# Patient Record
Sex: Male | Born: 1935 | Race: White | Hispanic: No | Marital: Married | Smoking: Never smoker
Health system: Southern US, Community
[De-identification: ages and names within clinical notes are randomized; demographics above are authoritative.]

## PROBLEM LIST (undated history)

## (undated) DIAGNOSIS — I10 Essential (primary) hypertension: Secondary | ICD-10-CM

## (undated) DIAGNOSIS — E119 Type 2 diabetes mellitus without complications: Secondary | ICD-10-CM

## (undated) DIAGNOSIS — M1990 Unspecified osteoarthritis, unspecified site: Secondary | ICD-10-CM

## (undated) DIAGNOSIS — E785 Hyperlipidemia, unspecified: Secondary | ICD-10-CM

## (undated) DIAGNOSIS — I341 Nonrheumatic mitral (valve) prolapse: Secondary | ICD-10-CM

## (undated) DIAGNOSIS — I2510 Atherosclerotic heart disease of native coronary artery without angina pectoris: Secondary | ICD-10-CM

## (undated) DIAGNOSIS — C439 Malignant melanoma of skin, unspecified: Secondary | ICD-10-CM

## (undated) DIAGNOSIS — B351 Tinea unguium: Secondary | ICD-10-CM

## (undated) DIAGNOSIS — K219 Gastro-esophageal reflux disease without esophagitis: Secondary | ICD-10-CM

## (undated) DIAGNOSIS — Z87442 Personal history of urinary calculi: Secondary | ICD-10-CM

## (undated) DIAGNOSIS — H269 Unspecified cataract: Secondary | ICD-10-CM

## (undated) DIAGNOSIS — C259 Malignant neoplasm of pancreas, unspecified: Secondary | ICD-10-CM

## (undated) HISTORY — DX: Gastro-esophageal reflux disease without esophagitis: K21.9

## (undated) HISTORY — DX: Atherosclerotic heart disease of native coronary artery without angina pectoris: I25.10

## (undated) HISTORY — DX: Malignant melanoma of skin, unspecified: C43.9

## (undated) HISTORY — DX: Unspecified cataract: H26.9

## (undated) HISTORY — DX: Hyperlipidemia, unspecified: E78.5

## (undated) HISTORY — DX: Tinea unguium: B35.1

## (undated) HISTORY — PX: SKIN CANCER EXCISION: SHX779

## (undated) HISTORY — DX: Personal history of urinary calculi: Z87.442

## (undated) HISTORY — PX: CHOLECYSTECTOMY: SHX55

## (undated) HISTORY — DX: Nonrheumatic mitral (valve) prolapse: I34.1

## (undated) HISTORY — DX: Unspecified osteoarthritis, unspecified site: M19.90

## (undated) HISTORY — DX: Malignant neoplasm of pancreas, unspecified: C25.9

---

## 2004-12-04 ENCOUNTER — Ambulatory Visit: Payer: Self-pay | Admitting: Surgery

## 2005-11-18 ENCOUNTER — Other Ambulatory Visit: Payer: Self-pay

## 2005-11-18 ENCOUNTER — Ambulatory Visit: Payer: Self-pay | Admitting: Surgery

## 2005-11-25 ENCOUNTER — Ambulatory Visit: Payer: Self-pay | Admitting: Surgery

## 2006-06-14 ENCOUNTER — Emergency Department: Payer: Self-pay | Admitting: Emergency Medicine

## 2006-06-30 ENCOUNTER — Emergency Department: Payer: Self-pay

## 2009-03-25 ENCOUNTER — Ambulatory Visit: Payer: Self-pay | Admitting: Cardiovascular Disease

## 2009-11-16 ENCOUNTER — Emergency Department: Payer: Self-pay | Admitting: Emergency Medicine

## 2010-08-19 ENCOUNTER — Ambulatory Visit: Payer: Self-pay | Admitting: Emergency Medicine

## 2010-08-21 ENCOUNTER — Ambulatory Visit: Payer: Self-pay | Admitting: Emergency Medicine

## 2010-08-31 ENCOUNTER — Ambulatory Visit: Payer: Self-pay | Admitting: Internal Medicine

## 2010-09-03 LAB — PATHOLOGY REPORT

## 2010-09-18 ENCOUNTER — Ambulatory Visit: Payer: Self-pay | Admitting: Internal Medicine

## 2010-09-30 ENCOUNTER — Ambulatory Visit: Payer: Self-pay | Admitting: Internal Medicine

## 2010-10-02 ENCOUNTER — Ambulatory Visit: Payer: Self-pay | Admitting: Emergency Medicine

## 2010-10-14 LAB — PATHOLOGY REPORT

## 2010-10-31 ENCOUNTER — Ambulatory Visit: Payer: Self-pay | Admitting: Internal Medicine

## 2010-11-30 ENCOUNTER — Ambulatory Visit: Payer: Self-pay | Admitting: Internal Medicine

## 2010-12-31 ENCOUNTER — Ambulatory Visit: Payer: Self-pay | Admitting: Internal Medicine

## 2011-01-31 ENCOUNTER — Ambulatory Visit: Payer: Self-pay | Admitting: Internal Medicine

## 2011-01-31 ENCOUNTER — Emergency Department: Payer: Self-pay | Admitting: Unknown Physician Specialty

## 2011-03-02 ENCOUNTER — Ambulatory Visit: Payer: Self-pay | Admitting: Internal Medicine

## 2011-03-04 ENCOUNTER — Inpatient Hospital Stay: Payer: Self-pay | Admitting: Emergency Medicine

## 2011-03-05 DIAGNOSIS — I4891 Unspecified atrial fibrillation: Secondary | ICD-10-CM

## 2011-03-25 ENCOUNTER — Ambulatory Visit: Payer: Self-pay | Admitting: Internal Medicine

## 2011-04-02 ENCOUNTER — Ambulatory Visit: Payer: Self-pay | Admitting: Internal Medicine

## 2011-04-17 ENCOUNTER — Encounter: Payer: Self-pay | Admitting: Emergency Medicine

## 2011-05-02 ENCOUNTER — Encounter: Payer: Self-pay | Admitting: Emergency Medicine

## 2011-06-02 ENCOUNTER — Encounter: Payer: Self-pay | Admitting: Emergency Medicine

## 2011-07-10 ENCOUNTER — Ambulatory Visit: Payer: Self-pay | Admitting: Internal Medicine

## 2011-07-10 LAB — CBC CANCER CENTER
Basophil #: 0 x10 3/mm (ref 0.0–0.1)
Eosinophil %: 2 %
Lymphocyte #: 0.6 x10 3/mm — ABNORMAL LOW (ref 1.0–3.6)
Lymphocyte %: 12.9 %
MCH: 30.1 pg (ref 26.0–34.0)
MCV: 89 fL (ref 80–100)
Monocyte #: 0.4 x10 3/mm (ref 0.0–0.7)
Monocyte %: 8 %
Platelet: 119 x10 3/mm — ABNORMAL LOW (ref 150–440)
RBC: 4.24 10*6/uL — ABNORMAL LOW (ref 4.40–5.90)
RDW: 13.9 % (ref 11.5–14.5)
WBC: 4.8 x10 3/mm (ref 3.8–10.6)

## 2011-07-10 LAB — CREATININE, SERUM
Creatinine: 1.41 mg/dL — ABNORMAL HIGH (ref 0.60–1.30)
EGFR (Non-African Amer.): 51 — ABNORMAL LOW

## 2011-07-10 LAB — HEPATIC FUNCTION PANEL A (ARMC)
Albumin: 4.2 g/dL (ref 3.4–5.0)
Alkaline Phosphatase: 64 U/L (ref 50–136)
Bilirubin, Direct: 0.1 mg/dL (ref 0.00–0.20)
Bilirubin,Total: 0.5 mg/dL (ref 0.2–1.0)

## 2011-07-10 LAB — LACTATE DEHYDROGENASE: LDH: 155 U/L (ref 87–241)

## 2011-07-31 ENCOUNTER — Ambulatory Visit: Payer: Self-pay | Admitting: Internal Medicine

## 2011-10-07 ENCOUNTER — Ambulatory Visit: Payer: Self-pay | Admitting: Internal Medicine

## 2011-10-07 LAB — CBC CANCER CENTER
Basophil %: 0.6 %
Eosinophil #: 0.1 x10 3/mm (ref 0.0–0.7)
Eosinophil %: 2 %
Lymphocyte %: 13.7 %
MCH: 29.4 pg (ref 26.0–34.0)
MCV: 90 fL (ref 80–100)
Monocyte #: 0.4 x10 3/mm (ref 0.2–1.0)
Monocyte %: 7.7 %
Neutrophil #: 3.9 x10 3/mm (ref 1.4–6.5)
Platelet: 119 x10 3/mm — ABNORMAL LOW (ref 150–440)
RBC: 4.29 10*6/uL — ABNORMAL LOW (ref 4.40–5.90)

## 2011-10-07 LAB — CREATININE, SERUM: EGFR (Non-African Amer.): 49 — ABNORMAL LOW

## 2011-10-07 LAB — LACTATE DEHYDROGENASE: LDH: 151 U/L (ref 87–241)

## 2011-10-07 LAB — HEPATIC FUNCTION PANEL A (ARMC)
Albumin: 3.9 g/dL (ref 3.4–5.0)
Alkaline Phosphatase: 53 U/L (ref 50–136)
Bilirubin, Direct: 0.1 mg/dL (ref 0.00–0.20)
Bilirubin,Total: 0.6 mg/dL (ref 0.2–1.0)
SGPT (ALT): 23 U/L
Total Protein: 6.9 g/dL (ref 6.4–8.2)

## 2011-10-31 ENCOUNTER — Ambulatory Visit: Payer: Self-pay | Admitting: Internal Medicine

## 2012-01-07 ENCOUNTER — Ambulatory Visit: Payer: Self-pay | Admitting: Internal Medicine

## 2012-01-31 ENCOUNTER — Ambulatory Visit: Payer: Self-pay | Admitting: Internal Medicine

## 2012-04-06 ENCOUNTER — Ambulatory Visit: Payer: Self-pay | Admitting: Internal Medicine

## 2012-04-06 LAB — CBC CANCER CENTER
Basophil #: 0 x10 3/mm (ref 0.0–0.1)
Eosinophil #: 0.1 x10 3/mm (ref 0.0–0.7)
Eosinophil %: 1.8 %
HCT: 39.7 % — ABNORMAL LOW (ref 40.0–52.0)
Lymphocyte #: 0.6 x10 3/mm — ABNORMAL LOW (ref 1.0–3.6)
MCH: 30.2 pg (ref 26.0–34.0)
MCHC: 32.5 g/dL (ref 32.0–36.0)
MCV: 93 fL (ref 80–100)
Monocyte #: 0.3 x10 3/mm (ref 0.2–1.0)
Platelet: 106 x10 3/mm — ABNORMAL LOW (ref 150–440)
RBC: 4.28 10*6/uL — ABNORMAL LOW (ref 4.40–5.90)

## 2012-04-06 LAB — HEPATIC FUNCTION PANEL A (ARMC)
Alkaline Phosphatase: 47 U/L — ABNORMAL LOW (ref 50–136)
Bilirubin, Direct: 0.1 mg/dL (ref 0.00–0.20)
Bilirubin,Total: 0.6 mg/dL (ref 0.2–1.0)
SGPT (ALT): 20 U/L (ref 12–78)
Total Protein: 7 g/dL (ref 6.4–8.2)

## 2012-04-06 LAB — CREATININE, SERUM
Creatinine: 1.52 mg/dL — ABNORMAL HIGH (ref 0.60–1.30)
EGFR (Non-African Amer.): 43 — ABNORMAL LOW

## 2012-04-06 LAB — FERRITIN: Ferritin (ARMC): 28 ng/mL (ref 8–388)

## 2012-04-06 LAB — FOLATE: Folic Acid: 18.6 ng/mL (ref 3.1–100.0)

## 2012-05-01 ENCOUNTER — Ambulatory Visit: Payer: Self-pay | Admitting: Internal Medicine

## 2012-05-02 IMAGING — US US EXTREM UP VENOUS*L*
1 series · 17 of 24 positions shown · non-contrast
Comparison: none

REASON FOR EXAM: cr [id number] eval for dvt pain swelling ademia
COMMENTS:

PROCEDURE:     US  - US DOPPLER UP EXTR LEFT  - March 25, 2011  [DATE]
RESULT:     Comparison: None
TECHNIQUE: Multiple gray-scale, color-flow Doppler, and spectral waveform
tracings of the left internal jugular vein, brachiocephalic vein, and
proximal deep veins of the left upper extremity from the antecubital fossa
to the brachiocephalic vein are presented for review.

[Series 1: us extrem up venous*left* · 17 of 42 slices shown]
[im 1/42]
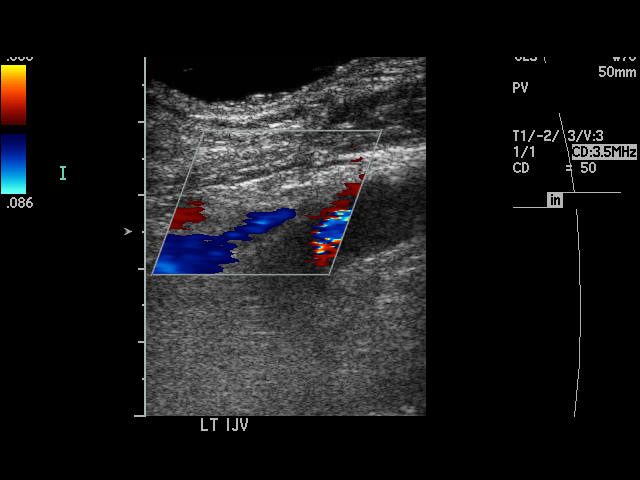
[im 4/42]
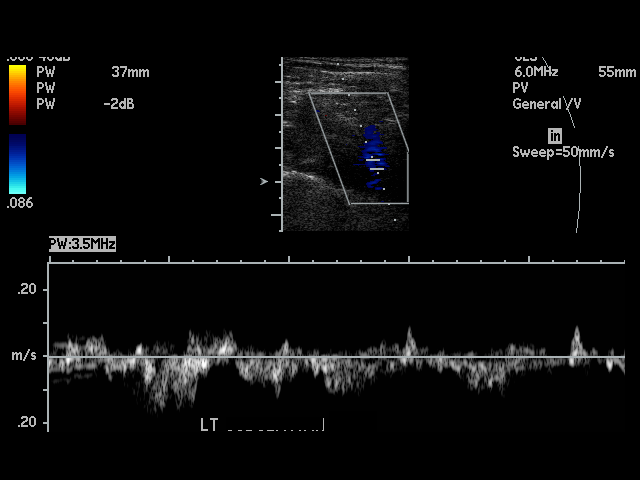
[im 6/42]
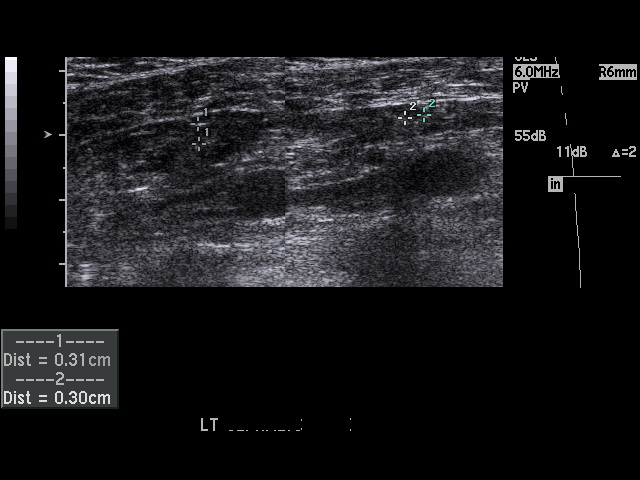
[im 8/42]
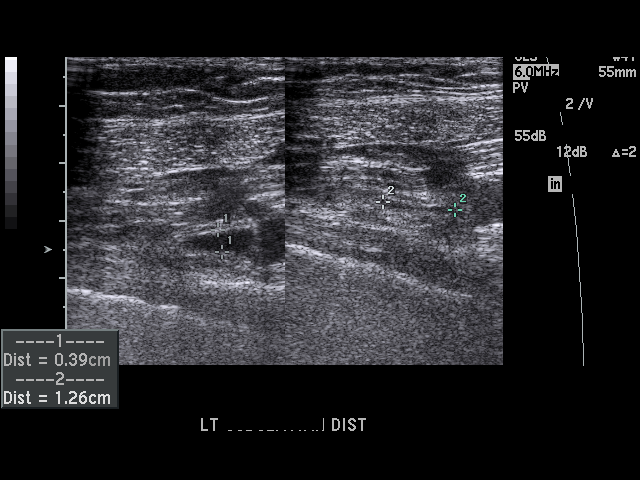
[im 11/42]
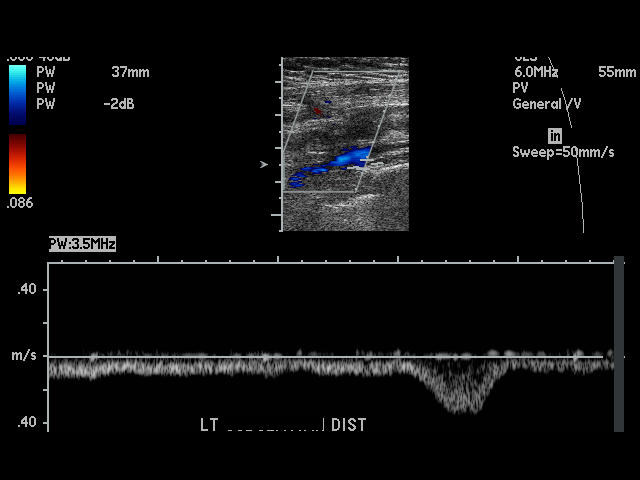
[im 13/42]
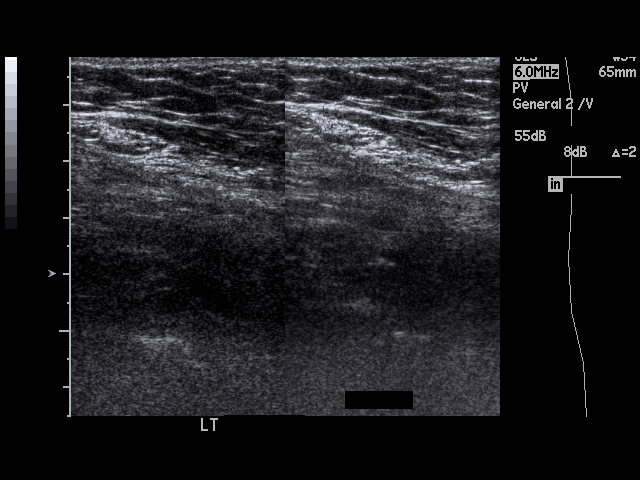
[im 17/42]
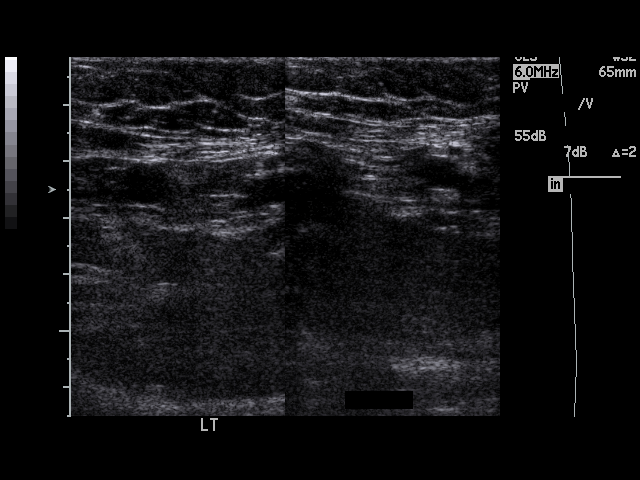
[im 18/42]
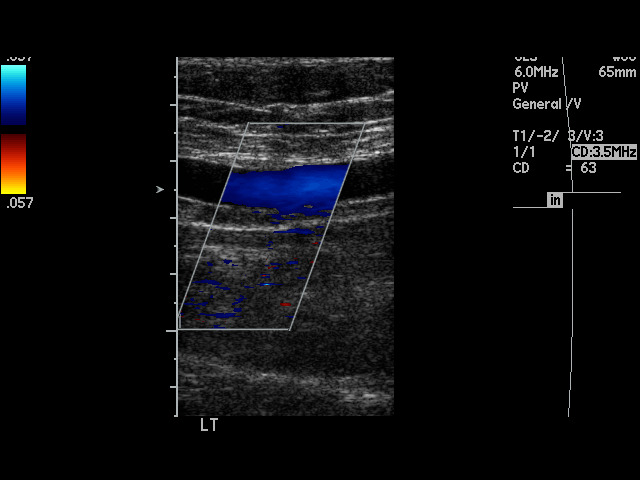
[im 22/42]
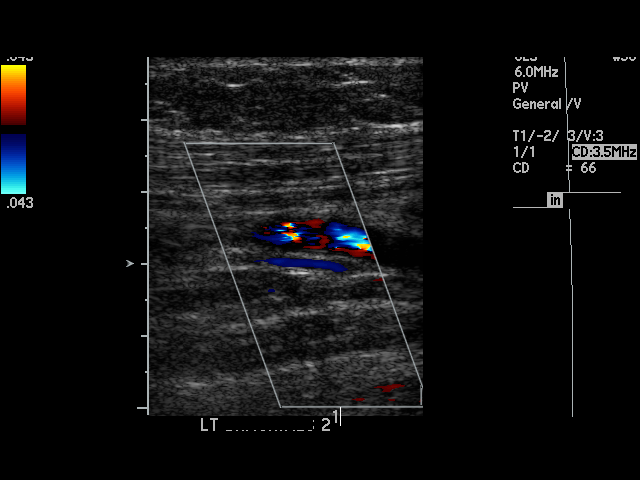
[im 24/42]
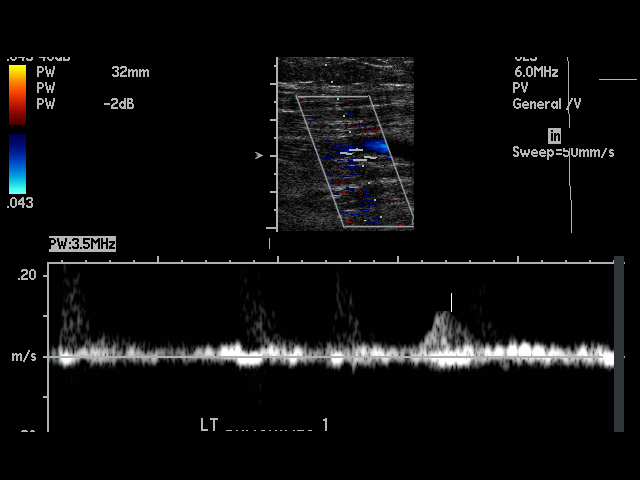
[im 25/42]
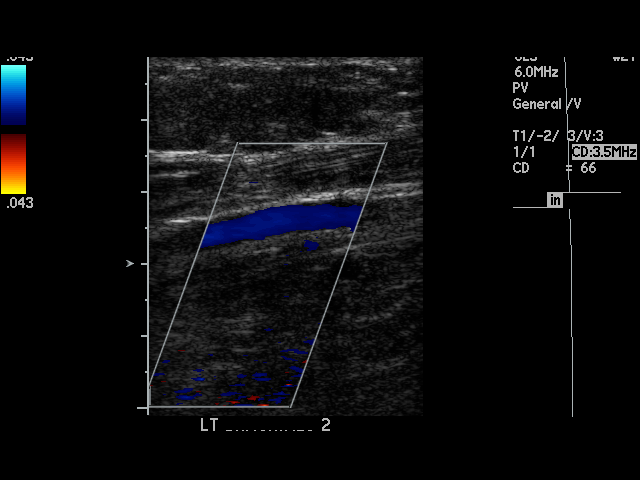
[im 29/42]
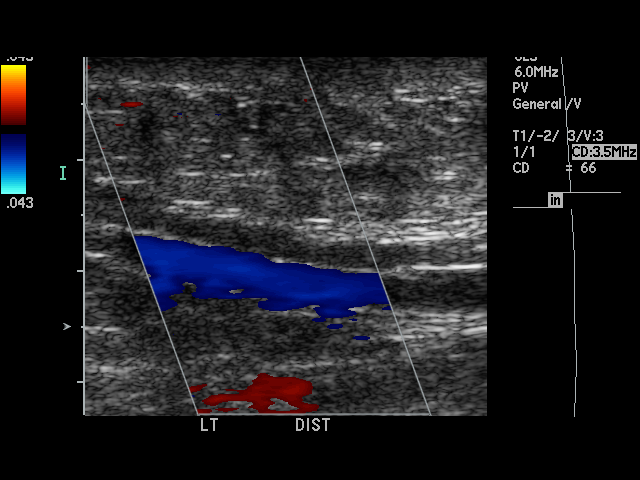
[im 31/42]
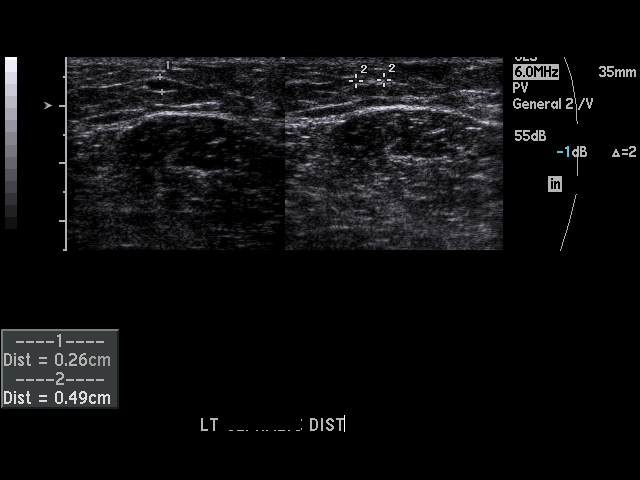
[im 34/42]
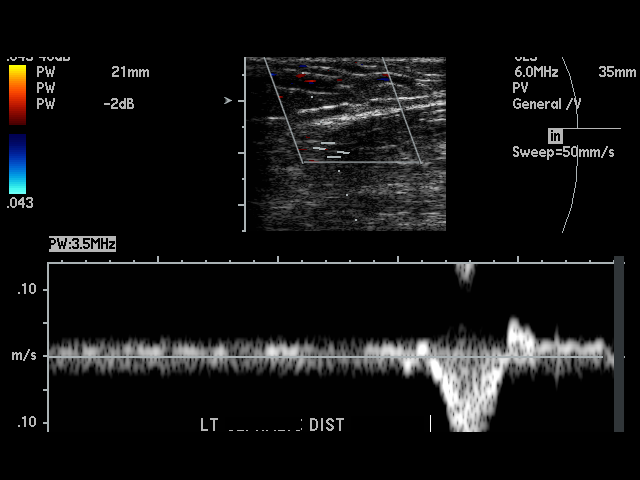
[im 36/42]
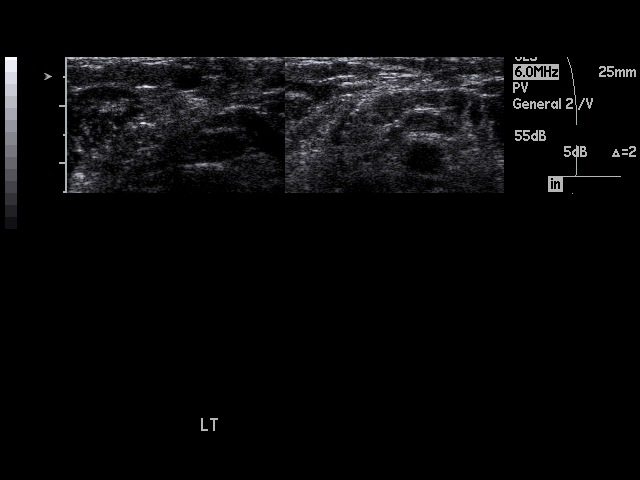
[im 38/42]
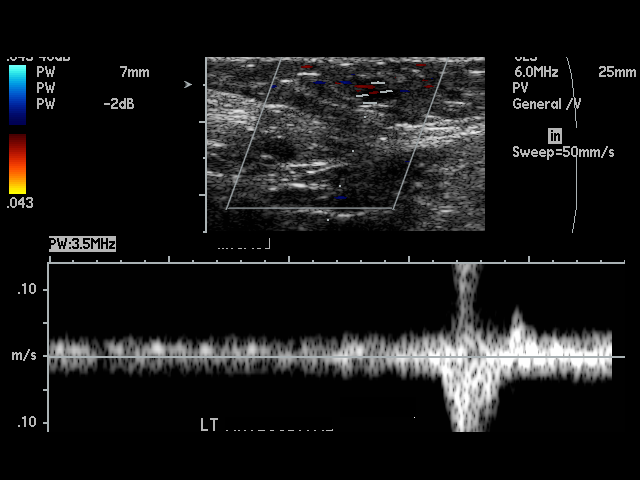
[im 42/42]
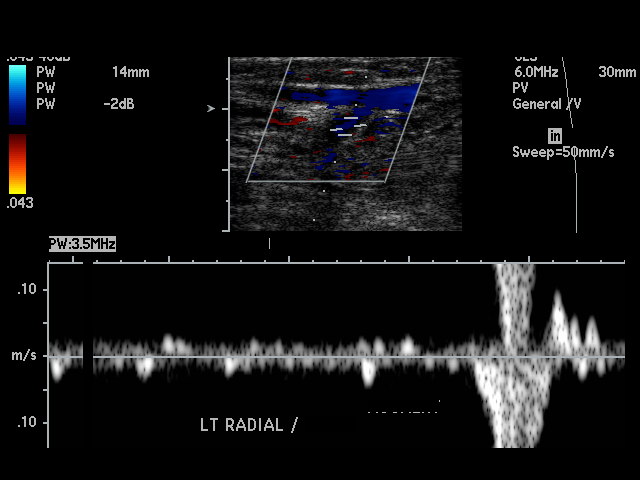

[17 of 24 positions shown; findings below may reference images not displayed]

FINDINGS: There is normal blood flow within the left internal jugular vein, visualized
portions of the brachiocephalic vein, and left upper extremity deep venous
system from the antecubital fossa to the brachiocephalic vein.
IMPRESSION: No sonographic evidence of deep venous thrombosis in the left upper
extremity.

## 2012-09-29 ENCOUNTER — Ambulatory Visit: Payer: Self-pay | Admitting: Ophthalmology

## 2012-09-29 ENCOUNTER — Ambulatory Visit: Payer: Self-pay | Admitting: Internal Medicine

## 2012-09-29 DIAGNOSIS — I10 Essential (primary) hypertension: Secondary | ICD-10-CM

## 2012-10-04 LAB — CBC CANCER CENTER
Eosinophil #: 0.1 x10 3/mm (ref 0.0–0.7)
Eosinophil %: 2.4 %
HCT: 37.8 % — ABNORMAL LOW (ref 40.0–52.0)
MCH: 29.8 pg (ref 26.0–34.0)
MCV: 90 fL (ref 80–100)
Monocyte #: 0.4 x10 3/mm (ref 0.2–1.0)
Neutrophil %: 75.4 %
Platelet: 117 x10 3/mm — ABNORMAL LOW (ref 150–440)
RBC: 4.2 10*6/uL — ABNORMAL LOW (ref 4.40–5.90)
WBC: 5.4 x10 3/mm (ref 3.8–10.6)

## 2012-10-04 LAB — CREATININE, SERUM
Creatinine: 1.38 mg/dL — ABNORMAL HIGH (ref 0.60–1.30)
EGFR (African American): 55 — ABNORMAL LOW
EGFR (Non-African Amer.): 48 — ABNORMAL LOW

## 2012-10-04 LAB — FOLATE: Folic Acid: 13.4 ng/mL (ref 3.1–100.0)

## 2012-10-04 LAB — HEPATIC FUNCTION PANEL A (ARMC)
Albumin: 3.8 g/dL (ref 3.4–5.0)
Alkaline Phosphatase: 53 U/L (ref 50–136)
Bilirubin, Direct: 0.1 mg/dL (ref 0.00–0.20)
Bilirubin,Total: 0.6 mg/dL (ref 0.2–1.0)
SGOT(AST): 18 U/L (ref 15–37)
Total Protein: 6.8 g/dL (ref 6.4–8.2)

## 2012-10-04 LAB — FERRITIN: Ferritin (ARMC): 25 ng/mL (ref 8–388)

## 2012-10-10 ENCOUNTER — Ambulatory Visit: Payer: Self-pay | Admitting: Ophthalmology

## 2012-10-30 ENCOUNTER — Ambulatory Visit: Payer: Self-pay | Admitting: Internal Medicine

## 2013-04-07 ENCOUNTER — Ambulatory Visit: Payer: Self-pay | Admitting: Internal Medicine

## 2013-04-07 LAB — HEPATIC FUNCTION PANEL A (ARMC)
Bilirubin,Total: 0.8 mg/dL (ref 0.2–1.0)
Total Protein: 7 g/dL (ref 6.4–8.2)

## 2013-04-07 LAB — CREATININE, SERUM
Creatinine: 1.46 mg/dL — ABNORMAL HIGH (ref 0.60–1.30)
EGFR (African American): 52 — ABNORMAL LOW

## 2013-04-07 LAB — CBC CANCER CENTER
Basophil #: 0 x10 3/mm (ref 0.0–0.1)
Basophil %: 0.4 %
Eosinophil #: 0.1 x10 3/mm (ref 0.0–0.7)
Eosinophil %: 2.5 %
Lymphocyte #: 0.9 x10 3/mm — ABNORMAL LOW (ref 1.0–3.6)
Lymphocyte %: 16.2 %
MCH: 30 pg (ref 26.0–34.0)
MCHC: 33.3 g/dL (ref 32.0–36.0)
Neutrophil %: 74.4 %
RBC: 4.3 10*6/uL — ABNORMAL LOW (ref 4.40–5.90)
WBC: 5.9 x10 3/mm (ref 3.8–10.6)

## 2013-04-07 LAB — LACTATE DEHYDROGENASE: LDH: 130 U/L (ref 85–241)

## 2013-05-01 ENCOUNTER — Ambulatory Visit: Payer: Self-pay | Admitting: Internal Medicine

## 2013-10-05 ENCOUNTER — Ambulatory Visit: Payer: Self-pay | Admitting: Internal Medicine

## 2013-10-06 LAB — CBC CANCER CENTER
BASOS PCT: 0.7 %
Basophil #: 0 x10 3/mm (ref 0.0–0.1)
Eosinophil #: 0.1 x10 3/mm (ref 0.0–0.7)
Eosinophil %: 1.6 %
HCT: 36.1 % — AB (ref 40.0–52.0)
HGB: 11.6 g/dL — ABNORMAL LOW (ref 13.0–18.0)
Lymphocyte #: 0.7 x10 3/mm — ABNORMAL LOW (ref 1.0–3.6)
Lymphocyte %: 10.5 %
MCH: 27.9 pg (ref 26.0–34.0)
MCHC: 32.2 g/dL (ref 32.0–36.0)
MCV: 87 fL (ref 80–100)
MONOS PCT: 6 %
Monocyte #: 0.4 x10 3/mm (ref 0.2–1.0)
NEUTROS PCT: 81.2 %
Neutrophil #: 5.4 x10 3/mm (ref 1.4–6.5)
PLATELETS: 160 x10 3/mm (ref 150–440)
RBC: 4.16 10*6/uL — AB (ref 4.40–5.90)
RDW: 13.3 % (ref 11.5–14.5)
WBC: 6.7 x10 3/mm (ref 3.8–10.6)

## 2013-10-06 LAB — FERRITIN: Ferritin (ARMC): 67 ng/mL (ref 8–388)

## 2013-10-06 LAB — HEPATIC FUNCTION PANEL A (ARMC)
ALK PHOS: 58 U/L
ALT: 18 U/L (ref 12–78)
Albumin: 3.6 g/dL (ref 3.4–5.0)
BILIRUBIN DIRECT: 0.1 mg/dL (ref 0.00–0.20)
Bilirubin,Total: 0.5 mg/dL (ref 0.2–1.0)
SGOT(AST): 14 U/L — ABNORMAL LOW (ref 15–37)
Total Protein: 7.2 g/dL (ref 6.4–8.2)

## 2013-10-06 LAB — CREATININE, SERUM
CREATININE: 1.32 mg/dL — AB (ref 0.60–1.30)
EGFR (African American): 58 — ABNORMAL LOW
EGFR (Non-African Amer.): 50 — ABNORMAL LOW

## 2013-10-06 LAB — LACTATE DEHYDROGENASE: LDH: 133 U/L (ref 85–241)

## 2013-10-30 ENCOUNTER — Ambulatory Visit: Payer: Self-pay | Admitting: Internal Medicine

## 2014-03-05 ENCOUNTER — Emergency Department: Payer: Self-pay | Admitting: Emergency Medicine

## 2014-03-05 LAB — CBC
HCT: 36.2 % — ABNORMAL LOW (ref 40.0–52.0)
HGB: 11.8 g/dL — ABNORMAL LOW (ref 13.0–18.0)
MCH: 29.4 pg (ref 26.0–34.0)
MCHC: 32.6 g/dL (ref 32.0–36.0)
MCV: 90 fL (ref 80–100)
Platelet: 145 10*3/uL — ABNORMAL LOW (ref 150–440)
RBC: 4.01 10*6/uL — ABNORMAL LOW (ref 4.40–5.90)
RDW: 13.8 % (ref 11.5–14.5)
WBC: 5.8 10*3/uL (ref 3.8–10.6)

## 2014-03-05 LAB — COMPREHENSIVE METABOLIC PANEL
Albumin: 3.6 g/dL (ref 3.4–5.0)
Alkaline Phosphatase: 52 U/L
Anion Gap: 5 — ABNORMAL LOW (ref 7–16)
BUN: 24 mg/dL — ABNORMAL HIGH (ref 7–18)
Bilirubin,Total: 0.5 mg/dL (ref 0.2–1.0)
Calcium, Total: 9.2 mg/dL (ref 8.5–10.1)
Chloride: 103 mmol/L (ref 98–107)
Co2: 29 mmol/L (ref 21–32)
Creatinine: 1.15 mg/dL (ref 0.60–1.30)
EGFR (African American): >60
EGFR (Non-African Amer.): >60
Glucose: 132 mg/dL — ABNORMAL HIGH (ref 65–99)
Osmolality: 280 (ref 275–301)
Potassium: 4.3 mmol/L (ref 3.5–5.1)
SGOT(AST): 23 U/L (ref 15–37)
SGPT (ALT): 21 U/L
Sodium: 137 mmol/L (ref 136–145)
Total Protein: 6.9 g/dL (ref 6.4–8.2)

## 2014-03-05 LAB — TROPONIN I: Troponin-I: <0.02 ng/mL

## 2014-03-26 ENCOUNTER — Ambulatory Visit: Payer: Medicare Other | Admitting: Internal Medicine

## 2014-04-13 ENCOUNTER — Ambulatory Visit: Payer: Self-pay | Admitting: Internal Medicine

## 2014-04-13 LAB — CREATININE, SERUM
CREATININE: 1.35 mg/dL — AB (ref 0.60–1.30)
EGFR (Non-African Amer.): 54 — ABNORMAL LOW

## 2014-04-13 LAB — CBC CANCER CENTER
Basophil #: 0 x10 3/mm (ref 0.0–0.1)
Basophil %: 0.6 %
EOS PCT: 2 %
Eosinophil #: 0.1 x10 3/mm (ref 0.0–0.7)
HCT: 35.8 % — AB (ref 40.0–52.0)
HGB: 11.7 g/dL — AB (ref 13.0–18.0)
LYMPHS ABS: 0.6 x10 3/mm — AB (ref 1.0–3.6)
LYMPHS PCT: 11.9 %
MCH: 29.1 pg (ref 26.0–34.0)
MCHC: 32.7 g/dL (ref 32.0–36.0)
MCV: 89 fL (ref 80–100)
Monocyte #: 0.3 x10 3/mm (ref 0.2–1.0)
Monocyte %: 5.6 %
Neutrophil #: 4.3 x10 3/mm (ref 1.4–6.5)
Neutrophil %: 79.9 %
Platelet: 137 x10 3/mm — ABNORMAL LOW (ref 150–440)
RBC: 4.02 10*6/uL — ABNORMAL LOW (ref 4.40–5.90)
RDW: 13.5 % (ref 11.5–14.5)
WBC: 5.3 x10 3/mm (ref 3.8–10.6)

## 2014-04-13 LAB — HEPATIC FUNCTION PANEL A (ARMC)
AST: 15 U/L (ref 15–37)
Albumin: 3.5 g/dL (ref 3.4–5.0)
Alkaline Phosphatase: 52 U/L
Bilirubin, Direct: 0.1 mg/dL (ref 0.0–0.2)
Bilirubin,Total: 0.4 mg/dL (ref 0.2–1.0)
SGPT (ALT): 19 U/L
Total Protein: 6.8 g/dL (ref 6.4–8.2)

## 2014-04-13 LAB — LACTATE DEHYDROGENASE: LDH: 125 U/L (ref 85–241)

## 2014-04-13 LAB — FERRITIN: Ferritin (ARMC): 34 ng/mL (ref 8–388)

## 2014-05-01 ENCOUNTER — Ambulatory Visit: Payer: Self-pay | Admitting: Internal Medicine

## 2014-05-09 ENCOUNTER — Encounter: Payer: Self-pay | Admitting: Internal Medicine

## 2014-06-01 ENCOUNTER — Encounter: Payer: Self-pay | Admitting: Internal Medicine

## 2014-07-02 ENCOUNTER — Encounter: Payer: Self-pay | Admitting: Internal Medicine

## 2014-07-10 ENCOUNTER — Ambulatory Visit: Payer: Medicare Other | Admitting: Internal Medicine

## 2014-09-21 NOTE — Op Note (Signed)
PATIENT NAME:  Matthew Spencer, Matthew Spencer MR#:  [id number] DATE OF BIRTH:  05/15/1929  DATE OF PROCEDURE:  10/10/2012  PREOPERATIVE DIAGNOSIS: Cataract, left eye.   POSTOPERATIVE DIAGNOSIS: Cataract, left eye.   PROCEDURE PERFORMED: Extracapsular cataract extraction using phacoemulsification with placement Alcon [id number], 20.5 diopter posterior chamber lens, serial number [id number].   SURGEON: Loura Back. Houda Brau, M.D.   ANESTHESIA: 4% lidocaine and 0.7% Marcaine in a 50-50 mixture with 10 units/mL of Hylenex added, given as peribulbar.   ANESTHESIOLOGIST: Dr. Ronelle Nigh   COMPLICATIONS: None.   ESTIMATED BLOOD LOSS: Less than 1 mL.   DESCRIPTION OF PROCEDURE:  The patient was brought to the operating room and given a peribulbar block.  The patient was then prepped and draped in the usual fashion.  The vertical rectus muscles were imbricated using 5-0 silk sutures.  These sutures were then clamped to the sterile drapes as bridle sutures.  A limbal peritomy was performed extending two clock hours and hemostasis was obtained with cautery.  A partial thickness scleral groove was made at the surgical limbus and dissected anteriorly in a lamellar dissection using an Alcon crescent knife.  The anterior chamber was entered supero-temporally with a Superblade and through the lamellar dissection with a 2.6 mm keratome.  DisCoVisc was used to replace the aqueous and a continuous tear capsulorrhexis was carried out.  Hydrodissection and hydrodelineation were carried out with balanced salt and a 27 gauge canula.  The nucleus was rotated to confirm the effectiveness of the hydrodissection.  Phacoemulsification was carried out using a divide-and-conquer technique.  Total ultrasound time was 1 minutes and 28 seconds with an average power of 26.8 percent. CDE 41.15.  Irrigation/aspiration was used to remove the residual cortex.  DisCoVisc was used to inflate the capsule and the internal incision was enlarged to 3 mm  with the crescent knife.  The intraocular lens was folded and inserted into the capsular bag using the AcrySert delivery system.  Irrigation/aspiration was used to remove the residual DisCoVisc.  Miostat was injected into the anterior chamber through the paracentesis track to inflate the anterior chamber and induce miosis.  The wound was checked for leaks and none were found. The conjunctiva was closed with cautery and the bridle sutures were removed.  An eye shield was placed on the eye. There was no antibiotic used.  The patient was discharged to the recovery room in good condition.     ____________________________ Loura Back Alejandro Adcox, MD sad:aw D: 10/10/2012 14:45:50 ET T: 10/11/2012 08:00:56 ET JOB#: [id number]  cc: Remo Lipps A. Janitza Revuelta, MD, <Dictator> Martie Lee MD ELECTRONICALLY SIGNED 10/12/2012 11:53

## 2014-12-11 ENCOUNTER — Other Ambulatory Visit: Payer: Self-pay

## 2014-12-11 DIAGNOSIS — C439 Malignant melanoma of skin, unspecified: Secondary | ICD-10-CM | POA: Insufficient documentation

## 2014-12-12 ENCOUNTER — Inpatient Hospital Stay: Payer: Medicare HMO | Attending: Internal Medicine

## 2014-12-12 ENCOUNTER — Inpatient Hospital Stay (HOSPITAL_BASED_OUTPATIENT_CLINIC_OR_DEPARTMENT_OTHER): Payer: Medicare HMO | Admitting: Internal Medicine

## 2014-12-12 ENCOUNTER — Inpatient Hospital Stay: Payer: Medicare HMO | Admitting: Oncology

## 2014-12-12 VITALS — BP 158/68 | HR 61 | Temp 95.1°F | Resp 18 | Ht 70.0 in | Wt 186.7 lb

## 2014-12-12 DIAGNOSIS — D649 Anemia, unspecified: Secondary | ICD-10-CM

## 2014-12-12 DIAGNOSIS — M545 Low back pain: Secondary | ICD-10-CM | POA: Insufficient documentation

## 2014-12-12 DIAGNOSIS — D696 Thrombocytopenia, unspecified: Secondary | ICD-10-CM | POA: Diagnosis not present

## 2014-12-12 DIAGNOSIS — C439 Malignant melanoma of skin, unspecified: Secondary | ICD-10-CM

## 2014-12-12 DIAGNOSIS — Z85820 Personal history of malignant melanoma of skin: Secondary | ICD-10-CM

## 2014-12-12 DIAGNOSIS — E875 Hyperkalemia: Secondary | ICD-10-CM | POA: Insufficient documentation

## 2014-12-12 DIAGNOSIS — E119 Type 2 diabetes mellitus without complications: Secondary | ICD-10-CM | POA: Diagnosis not present

## 2014-12-12 DIAGNOSIS — Z7982 Long term (current) use of aspirin: Secondary | ICD-10-CM

## 2014-12-12 DIAGNOSIS — G8929 Other chronic pain: Secondary | ICD-10-CM

## 2014-12-12 DIAGNOSIS — Z79899 Other long term (current) drug therapy: Secondary | ICD-10-CM

## 2014-12-12 DIAGNOSIS — M1990 Unspecified osteoarthritis, unspecified site: Secondary | ICD-10-CM | POA: Diagnosis not present

## 2014-12-12 DIAGNOSIS — K219 Gastro-esophageal reflux disease without esophagitis: Secondary | ICD-10-CM

## 2014-12-12 LAB — CREATININE, SERUM
Creatinine, Ser: 1.21 mg/dL (ref 0.61–1.24)
GFR calc non Af Amer: 54 mL/min — ABNORMAL LOW (ref >60)

## 2014-12-12 LAB — CBC
HEMATOCRIT: 37.6 % — AB (ref 40.0–52.0)
Hemoglobin: 12.1 g/dL — ABNORMAL LOW (ref 13.0–18.0)
MCH: 28.9 pg (ref 26.0–34.0)
MCHC: 32.2 g/dL (ref 32.0–36.0)
MCV: 89.7 fL (ref 80.0–100.0)
Platelets: 152 10*3/uL (ref 150–440)
RBC: 4.19 MIL/uL — AB (ref 4.40–5.90)
RDW: 13.4 % (ref 11.5–14.5)
WBC: 6.8 10*3/uL (ref 3.8–10.6)

## 2014-12-12 LAB — HEPATIC FUNCTION PANEL
ALT: 15 U/L — AB (ref 17–63)
AST: 20 U/L (ref 15–41)
Albumin: 4.3 g/dL (ref 3.5–5.0)
Alkaline Phosphatase: 39 U/L (ref 38–126)
Bilirubin, Direct: 0.1 mg/dL (ref 0.1–0.5)
Indirect Bilirubin: 0.7 mg/dL (ref 0.3–0.9)
Total Bilirubin: 0.8 mg/dL (ref 0.3–1.2)
Total Protein: 7.4 g/dL (ref 6.5–8.1)

## 2014-12-12 LAB — LACTATE DEHYDROGENASE: LDH: 133 U/L (ref 98–192)

## 2014-12-12 LAB — FERRITIN: Ferritin: 42 ng/mL (ref 24–336)

## 2015-01-03 NOTE — Progress Notes (Signed)
[place]  [phone]) [phone] Fax:(336) [phone]     ID: Matthew Spencer OB: Mar 25, 1929  MR#: [id number]  UVO#:[id number]  Patient Care Team: Venia Carbon, MD as PCP - General (Internal Medicine)  CHIEF COMPLAINT/DIAGNOSIS:  Tx N1 M0 (clinical, stage III) Malignant left axillary area malignant melanoma, s/p surgical resection x 2 (on 08/21/10 and 10/02/10). Repeat surgery on 10/02/10 LEFT AXILLARY CONTENTS, AXILLARY DISSECTION:  - RESIDUAL MALIGNANT SPINDLE CELL NEOPLASM, 1.5 CM, ADJACENT TO LARGE SEROMA FROM PREVIOUS SURGERY.  - MARGINS CLEAR; CLOSEST MARGIN IS 0.1 CM FROM TUMOR.  - 23 AXILLARY LYMPH NODES NEGATIVE FOR MALIGNANCY (0/23).  - SKIN WITH SCAR; NEGATIVE FOR SKIN INVOLVEMENT BY TUMOR.  Part B: ADDITIONAL LEVEL 2 AND 3 NODES LEFT AXILLA: - 4 AXILLARY LYMPH NODES NEGATIVE FOR MALIGNANCY (0/4). Has completed radiation to the left axillary area, decided against taking interferon treatment.  PET scan April 2012 -  5.4 x 3.7 cm left axillary mass with a small focal hypermetabolic area within it. This is an area of recent surgical manipulation. Given the recent surgery this area may resent postsurgical changes versus an area of malignancy. There are small areas of hypermetabolic activity within skin of the medial aspect of the lower left arm just above the elbow. This may be secondary to an infectious or inflammatory etiology although malignancy such as melanoma can have a similar appearance.  CT brain April 2012 - negative for metastases.    HISTORY OF PRESENT ILLNESS:  Patient returns for continued oncology followup, he was seen 8 months ago. States that he is doing about the same, denies new complaints. Eating well, states that weight is steady. He denies any new bone pains states chronic low back pain and bilateral knee pain from arthritis is same. No new headaches, imbalance or falls. He has chronic mild thrombocytopenia, denies any obvious bleeding  symptoms. Denies feeling any enlarged lymph node masses in the axilla or other areas. No new cough, chest pain or hemoptysis. Denies recurrent melanoma since last visit, states he follows with dermatologist also.   REVIEW OF SYSTEMS:   ROS As in HPI above. In addition, no fever, chills or sweats. No new headaches or focal weakness.  No new sore throat, cough, shortness of breath, sputum, hemoptysis or chest pain. No new abdominal pain, constipation, diarrhea, dysuria or hematuria. No new skin rash or bleeding symptoms. No new paresthesias in extremities. PS ECOG 0.  PAST MEDICAL HISTORY: Reviewed.         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    PAST SURGICAL HISTORY: Reviewed. As above  FAMILY HISTORY: Reviewed. Noncontributory, denies malignancy or hematological disorders.  SOCIAL HISTORY: Reviewed. Denies smoking, alcohol or recreational drug usage.    Allergies  Allergen Reactions  . Penicillins Rash  . Warfarin Sodium Rash    Current Outpatient Prescriptions  Medication Sig Dispense Refill  . acetaminophen (TYLENOL) 325 MG tablet     . apixaban (ELIQUIS) 5 MG TABS tablet Take 5 mg by mouth 2 (two) times daily.    Marland Kitchen aspirin EC 81 MG tablet     . carvedilol (COREG) 6.25 MG tablet twice a day    . Cholecalciferol (VITAMIN D-1000 MAX ST) 1000 UNITS tablet Take by  mouth.    . diclofenac sodium (VOLTAREN) 1 % GEL Apply topically.    . enalapril (VASOTEC) 20 MG tablet twice a day    . fenofibrate 160 MG tablet once a day    . fluticasone (FLONASE) 50 MCG/ACT nasal spray Place into the nose.    Marland Kitchen glipiZIDE (GLUCOTROL) 10 MG tablet twice a day    . hydrochlorothiazide (MICROZIDE) 12.5 MG capsule      . Multiple Vitamins-Minerals (MULTIVITAMIN WITH MINERALS) tablet     . nitroGLYCERIN (NITROSTAT) 0.3 MG SL tablet use as directed    . ranitidine (ZANTAC) 150 MG tablet twice a day    . simvastatin (ZOCOR) 20 MG tablet     . sitaGLIPtin-metformin (JANUMET) 50-1000 MG per tablet twice a day    . traMADol (ULTRAM) 50 MG tablet 1 every 6 hours    . vitamin E 400 UNIT capsule Take by mouth.     No current facility-administered medications for this visit.    PHYSICAL EXAM: Filed Vitals:   12/12/14 1130  BP: 158/68  Pulse: 61  Temp: 95.1 F (35.1 C)  Resp: 18     Body mass index is 26.79 kg/(m^2).    ECOG FS:0 - Asymptomatic  GENERAL: Patient is alert and oriented and in no acute distress. There is no icterus. HEENT: EOMs intact. Oral exam negative for thrush or lesions. No cervical lymphadenopathy. CVS: S1S2, regular LUNGS: Bilaterally clear to auscultation, no rhonchi. ABDOMEN: Soft, nontender. No hepatomegaly clinically.  EXTREMITIES: No pedal edema. LYMPHATICS: No palpable adenopathy in axillary or inguinal areas.   LAB RESULTS: Cr 1.21, serum LDH 133, ferritin 42.     Component Value Date/Time   NA 137 03/05/2014 0317   K 4.3 03/05/2014 0317   CL 103 03/05/2014 0317   CO2 29 03/05/2014 0317   GLUCOSE 132* 03/05/2014 0317   BUN 24* 03/05/2014 0317   CREATININE 1.21 12/12/2014 1054   CREATININE 1.35* 04/13/2014 1041   CALCIUM 9.2 03/05/2014 0317   PROT 7.4 12/12/2014 1054   PROT 6.8 04/13/2014 1041   ALBUMIN 4.3 12/12/2014 1054   ALBUMIN 3.5 04/13/2014 1041   AST 20 12/12/2014 1054   AST 15 04/13/2014 1041   ALT 15* 12/12/2014 1054   ALT 19 04/13/2014 1041   ALKPHOS 39 12/12/2014 1054   ALKPHOS 52 04/13/2014 1041   BILITOT 0.8 12/12/2014 1054   BILITOT 0.4 04/13/2014 1041   GFRNONAA 54* 12/12/2014 1054   GFRNONAA 50* 10/06/2013 1011   GFRAA >60 12/12/2014 1054   GFRAA 58* 10/06/2013 1011    Lab Results  Component Value Date   WBC 6.8 12/12/2014    NEUTROABS 4.3 04/13/2014   HGB 12.1* 12/12/2014   HCT 37.6* 12/12/2014   MCV 89.7 12/12/2014   PLT 152 12/12/2014    STUDIES: PET scan in April 2012 -  5.4 x 3.7 cm left axillary mass with a small focal hypermetabolic area within it. This is an area of recent surgical manipulation. Given the recent surgery this area may resent postsurgical changes versus an area of malignancy. There are small areas of hypermetabolic activity within skin of the medial aspect of the lower left arm just above the elbow. This may be secondary to an infectious or inflammatory etiology although malignancy such as melanoma can have a similar appearance.   CT scan brain in April 2012 - negative for metastases.   ASSESSMENT / PLAN:   1. h/o Tx N1 M0 (clinical, stage III) Malignant left axillary area  malignant melanoma, s/p surgical resection x 2 (on 08/21/10 and 10/02/10) -   Have reviewed labs from today and d/w patient. He is overall doing steady without any clinical symptoms to suggest recurrent or metastatic melanoma.  Serum LDH and LFTs are unremarkable. No recurrent axillary adenopathy. Patient does not want any radiological surveillance unless he develops new symptoms or lab abnormalities. Plan is to continue surveillance clinically. Will see him back in 32 weeks with repeat labs. 2. Thrombocytopenia mild and intermittent - no bleeding symptoms.  Platelet count remains in low normal range today (was 139 in April 2012, 117 in May 2014 and is 152 today). Patient explained that he could have underlying ITP or myelodysplasia versus other disorder but does not want further workup at this time.  Will continue to monitor CBC upon next visit here, and consider further workup if counts worsen in the future.  3. Anemia - mild, nonspecific. Serum ferritin is normal today. Plan as above. 4. In between visits, he was advised to call in case of any bleeding issues, unintentional weight loss, new symptoms or sickness. He is agreeable  to this plan.   Leia Alf, MD   01/03/2015 6:55 AM

## 2015-02-21 ENCOUNTER — Other Ambulatory Visit: Payer: Self-pay | Admitting: Internal Medicine

## 2015-02-21 DIAGNOSIS — R978 Other abnormal tumor markers: Secondary | ICD-10-CM

## 2015-02-21 DIAGNOSIS — E1165 Type 2 diabetes mellitus with hyperglycemia: Secondary | ICD-10-CM

## 2015-02-21 DIAGNOSIS — IMO0002 Reserved for concepts with insufficient information to code with codable children: Secondary | ICD-10-CM

## 2015-02-22 ENCOUNTER — Ambulatory Visit: Payer: Medicare HMO

## 2015-02-25 ENCOUNTER — Ambulatory Visit
Admission: RE | Admit: 2015-02-25 | Discharge: 2015-02-25 | Disposition: A | Discharge status: Home / Self Care | Payer: Medicare HMO | Source: Ambulatory Visit | Attending: Internal Medicine | Admitting: Internal Medicine

## 2015-02-25 DIAGNOSIS — E1165 Type 2 diabetes mellitus with hyperglycemia: Secondary | ICD-10-CM

## 2015-02-25 DIAGNOSIS — N281 Cyst of kidney, acquired: Secondary | ICD-10-CM | POA: Insufficient documentation

## 2015-02-25 DIAGNOSIS — I700 Atherosclerosis of aorta: Secondary | ICD-10-CM | POA: Insufficient documentation

## 2015-02-25 DIAGNOSIS — R911 Solitary pulmonary nodule: Secondary | ICD-10-CM | POA: Diagnosis not present

## 2015-02-25 DIAGNOSIS — R978 Other abnormal tumor markers: Secondary | ICD-10-CM | POA: Diagnosis present

## 2015-02-25 DIAGNOSIS — I2510 Atherosclerotic heart disease of native coronary artery without angina pectoris: Secondary | ICD-10-CM | POA: Diagnosis not present

## 2015-02-25 DIAGNOSIS — IMO0002 Reserved for concepts with insufficient information to code with codable children: Secondary | ICD-10-CM

## 2015-02-25 HISTORY — DX: Essential (primary) hypertension: I10

## 2015-02-25 HISTORY — DX: Type 2 diabetes mellitus without complications: E11.9

## 2015-02-25 LAB — POCT I-STAT CREATININE: Creatinine, Ser: 1.2 mg/dL (ref 0.61–1.24)

## 2015-02-25 MED ORDER — IOHEXOL 300 MG/ML  SOLN
100.0000 mL | Freq: Once | INTRAMUSCULAR | Status: AC | PRN
  Administered 2015-02-25: 100 mL via INTRAVENOUS

## 2015-02-26 ENCOUNTER — Telehealth: Payer: Self-pay

## 2015-02-26 NOTE — Telephone Encounter (Signed)
  Oncology Nurse Navigator Documentation  Referral date to RadOnc/MedOnc: 02/25/15 (02/26/15 1000) Navigator Encounter Type: Introductory phone call (02/26/15 1000)         Interventions: Coordination of Care (02/26/15 1000)   Coordination of Care: EUS (02/26/15 1000)        Time Spent with Patient: 30 (02/26/15 1000)   Spoke with Matthew Spencer on the phone. Received referral for EUS to be scheduled ASAP. Not available at [facility] until 10/20. Will arrange at [facility]. Spoke to him further regarding CA 19-9 and CT imaging. He also see Matthew Spencer for Melanoma. Matthew Spencer made aware of these new findings on CT and we will arrange for a follow up appt after EUS. Matthew Lacko notified that [facility] will contact him for scheduling.

## 2015-02-27 ENCOUNTER — Other Ambulatory Visit: Payer: Medicare HMO

## 2015-03-02 DIAGNOSIS — C259 Malignant neoplasm of pancreas, unspecified: Secondary | ICD-10-CM

## 2015-03-02 HISTORY — DX: Malignant neoplasm of pancreas, unspecified: C25.9

## 2015-03-02 HISTORY — PX: UPPER ESOPHAGEAL ENDOSCOPIC ULTRASOUND (EUS): SHX6562

## 2015-03-06 ENCOUNTER — Inpatient Hospital Stay: Payer: Medicare HMO

## 2015-03-06 ENCOUNTER — Inpatient Hospital Stay: Payer: Medicare HMO | Attending: Internal Medicine | Admitting: Internal Medicine

## 2015-03-06 VITALS — BP 129/65 | HR 67 | Temp 98.4°F | Wt 195.1 lb

## 2015-03-06 DIAGNOSIS — M1990 Unspecified osteoarthritis, unspecified site: Secondary | ICD-10-CM | POA: Diagnosis not present

## 2015-03-06 DIAGNOSIS — I2510 Atherosclerotic heart disease of native coronary artery without angina pectoris: Secondary | ICD-10-CM

## 2015-03-06 DIAGNOSIS — C787 Secondary malignant neoplasm of liver and intrahepatic bile duct: Secondary | ICD-10-CM | POA: Insufficient documentation

## 2015-03-06 DIAGNOSIS — R5382 Chronic fatigue, unspecified: Secondary | ICD-10-CM | POA: Diagnosis not present

## 2015-03-06 DIAGNOSIS — Z7982 Long term (current) use of aspirin: Secondary | ICD-10-CM | POA: Diagnosis not present

## 2015-03-06 DIAGNOSIS — Z79899 Other long term (current) drug therapy: Secondary | ICD-10-CM | POA: Diagnosis not present

## 2015-03-06 DIAGNOSIS — Z7901 Long term (current) use of anticoagulants: Secondary | ICD-10-CM | POA: Diagnosis not present

## 2015-03-06 DIAGNOSIS — R531 Weakness: Secondary | ICD-10-CM

## 2015-03-06 DIAGNOSIS — K219 Gastro-esophageal reflux disease without esophagitis: Secondary | ICD-10-CM | POA: Diagnosis not present

## 2015-03-06 DIAGNOSIS — E785 Hyperlipidemia, unspecified: Secondary | ICD-10-CM | POA: Insufficient documentation

## 2015-03-06 DIAGNOSIS — E118 Type 2 diabetes mellitus with unspecified complications: Secondary | ICD-10-CM | POA: Diagnosis not present

## 2015-03-06 DIAGNOSIS — D649 Anemia, unspecified: Secondary | ICD-10-CM | POA: Diagnosis not present

## 2015-03-06 DIAGNOSIS — I341 Nonrheumatic mitral (valve) prolapse: Secondary | ICD-10-CM | POA: Diagnosis not present

## 2015-03-06 DIAGNOSIS — Z7984 Long term (current) use of oral hypoglycemic drugs: Secondary | ICD-10-CM | POA: Diagnosis not present

## 2015-03-06 DIAGNOSIS — C251 Malignant neoplasm of body of pancreas: Secondary | ICD-10-CM | POA: Diagnosis not present

## 2015-03-06 DIAGNOSIS — R634 Abnormal weight loss: Secondary | ICD-10-CM | POA: Diagnosis not present

## 2015-03-06 DIAGNOSIS — R110 Nausea: Secondary | ICD-10-CM | POA: Diagnosis not present

## 2015-03-06 DIAGNOSIS — C250 Malignant neoplasm of head of pancreas: Secondary | ICD-10-CM | POA: Insufficient documentation

## 2015-03-06 DIAGNOSIS — Z85820 Personal history of malignant melanoma of skin: Secondary | ICD-10-CM

## 2015-03-06 DIAGNOSIS — R911 Solitary pulmonary nodule: Secondary | ICD-10-CM | POA: Insufficient documentation

## 2015-03-06 LAB — HEPATIC FUNCTION PANEL
ALK PHOS: 46 U/L (ref 38–126)
ALT: 17 U/L (ref 17–63)
AST: 20 U/L (ref 15–41)
Albumin: 3.6 g/dL (ref 3.5–5.0)
BILIRUBIN DIRECT: 0.1 mg/dL (ref 0.1–0.5)
BILIRUBIN INDIRECT: 0.1 mg/dL — AB (ref 0.3–0.9)
TOTAL PROTEIN: 7 g/dL (ref 6.5–8.1)
Total Bilirubin: 0.2 mg/dL — ABNORMAL LOW (ref 0.3–1.2)

## 2015-03-06 LAB — CBC WITH DIFFERENTIAL/PLATELET
BASOS ABS: 0 10*3/uL (ref 0–0.1)
BASOS PCT: 0 %
EOS ABS: 0.1 10*3/uL (ref 0–0.7)
EOS PCT: 1 %
HCT: 32.8 % — ABNORMAL LOW (ref 40.0–52.0)
Hemoglobin: 10.8 g/dL — ABNORMAL LOW (ref 13.0–18.0)
Lymphocytes Relative: 10 %
Lymphs Abs: 0.6 10*3/uL — ABNORMAL LOW (ref 1.0–3.6)
MCH: 29 pg (ref 26.0–34.0)
MCHC: 32.7 g/dL (ref 32.0–36.0)
MCV: 88.6 fL (ref 80.0–100.0)
Monocytes Absolute: 0.4 10*3/uL (ref 0.2–1.0)
Monocytes Relative: 7 %
Neutro Abs: 4.5 10*3/uL (ref 1.4–6.5)
Neutrophils Relative %: 82 %
PLATELETS: 150 10*3/uL (ref 150–440)
RBC: 3.71 MIL/uL — AB (ref 4.40–5.90)
RDW: 13.6 % (ref 11.5–14.5)
WBC: 5.6 10*3/uL (ref 3.8–10.6)

## 2015-03-06 LAB — CREATININE, SERUM
CREATININE: 1.08 mg/dL (ref 0.61–1.24)
GFR calc non Af Amer: >60 mL/min (ref >60)

## 2015-03-06 NOTE — Progress Notes (Signed)
[place]  Telephone:(336) [phone] Fax:(336) [phone]     ID: Matthew Spencer OB: [birth year]  MR#: [id number]  BMW#:[id number]  Patient Care Team: Jodi Marble, MD as PCP - General (Internal Medicine)  CHIEF COMPLAINT/DIAGNOSIS:  1.  Newly diagnosed pancreatic adenocarcinoma with likely small liver metastasis and small lung nodule left lower lobe (EUS at [facility] on 03/01/15 reported mass measuring 23.5 mm x 14.2 mm, biopsy was adenocarcinoma. EUS also reported single lesion in the left lobe of the liver suggestive of metastasis measuring 7 mm x 7.2 mm). 02/25/15 - CT scan of the abdomen reported multiple hypo enhancing hepatic lesions highly worrisome for metastatic disease. In this context, there is suspicion of a small lesion involving the pancreatic body with associated dilatation of the pancreatic duct, worrisome for primary pancreatic adenocarcinoma. Indeterminate small pulmonary nodule in the left lower lobe.  2.  Tx N1 M0 (clinical, stage III) Malignant left axillary area malignant melanoma, s/p surgical resection x 2 (on 08/21/10 and 10/02/10). Repeat surgery on 10/02/10 LEFT AXILLARY CONTENTS, AXILLARY DISSECTION:  - RESIDUAL MALIGNANT SPINDLE CELL NEOPLASM, 1.5 CM, ADJACENT TO LARGE SEROMA FROM PREVIOUS SURGERY.  - MARGINS CLEAR; CLOSEST MARGIN IS 0.1 CM FROM TUMOR.  - 23 AXILLARY LYMPH NODES NEGATIVE FOR MALIGNANCY (0/23).  - SKIN WITH SCAR; NEGATIVE FOR SKIN INVOLVEMENT BY TUMOR.  Part B: ADDITIONAL LEVEL 2 AND 3 NODES LEFT AXILLA: - 4 AXILLARY LYMPH NODES NEGATIVE FOR MALIGNANCY (0/4). Has completed radiation to the left axillary area, decided against taking interferon treatment. PET scan April 2012 -  5.4 x 3.7 cm left axillary mass with a small focal hypermetabolic area within it. This is an area of recent surgical manipulation. Given the recent surgery this area may resent postsurgical changes versus an area of malignancy. There are small areas of  hypermetabolic activity within skin of the medial aspect of the lower left arm just above the elbow. This may be secondary to an infectious or inflammatory etiology although malignancy such as melanoma can have a similar appearance.  CT brain April 2012 - negative for metastases.    HISTORY OF PRESENT ILLNESS:  Patient being seen today for oncology evaluation since he has been newly diagnosed with pancreatic adenocarcinoma on endoscopic ultrasound/biopsy done at [facility] on September 30. States that in the last month or so his diabetes went out of control and he started losing weight unintentionally. He denies having any severe abdominal pain, nausea or vomiting. Appetite is fairly steady. He continues to have chronic fatigue and weakness, states that his physical activity is more limited due to arthritis in the knees. Denies any blood in stools or urine. No new cough, chest pain, dyspnea or hemoptysis.   REVIEW OF SYSTEMS:   ROS As in HPI above. In addition, no fever, chills. No new headaches or focal weakness.  No sore throat or dysphagia. No dysuria or hematuria. No new skin rash or bleeding symptoms. No new paresthesias in extremities. PS ECOG 1.  PAST MEDICAL HISTORY: Reviewed.         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    PAST SURGICAL HISTORY: Reviewed. As above  FAMILY HISTORY: Reviewed. Noncontributory, denies malignancy or hematological disorders.  SOCIAL HISTORY: Reviewed. Denies smoking, alcohol  or recreational drug usage.    Allergies  Allergen Reactions  . Penicillins Rash  . Warfarin Sodium Rash    Current Outpatient Prescriptions  Medication Sig Dispense Refill  . acetaminophen  (TYLENOL) 325 MG tablet     . apixaban (ELIQUIS) 5 MG TABS tablet Take 5 mg by mouth 2 (two) times daily.    Marland Kitchen aspirin EC 81 MG tablet     . carvedilol (COREG) 6.25 MG tablet twice a day    . Cholecalciferol (VITAMIN D-1000 MAX ST) 1000 UNITS tablet Take by mouth.    . diclofenac sodium (VOLTAREN) 1 % GEL Apply topically.    . enalapril (VASOTEC) 20 MG tablet twice a day    . fenofibrate 160 MG tablet once a day    . fluticasone (FLONASE) 50 MCG/ACT nasal spray Place into the nose.    Marland Kitchen glipiZIDE (GLUCOTROL) 10 MG tablet twice a day    . hydrochlorothiazide (MICROZIDE) 12.5 MG capsule     . Multiple Vitamins-Minerals (MULTIVITAMIN WITH MINERALS) tablet     . nitroGLYCERIN (NITROSTAT) 0.3 MG SL tablet use as directed    . ranitidine (ZANTAC) 150 MG tablet twice a day    . simvastatin (ZOCOR) 20 MG tablet     . sitaGLIPtin-metformin (JANUMET) 50-1000 MG per tablet twice a day    . traMADol (ULTRAM) 50 MG tablet 1 every 6 hours    . vitamin E 400 UNIT capsule Take by mouth.     No current facility-administered medications for this visit.    PHYSICAL EXAM: Filed Vitals:   03/06/15 1527  BP: 129/65  Pulse: 67  Temp: 98.4 F (36.9 C)     Body mass index is 27.99 kg/(m^2).       GENERAL: Patient is alert and oriented and in no acute distress. There is no icterus. HEENT: EOMs intact. No cervical lymphadenopathy. CVS: S1S2, regular LUNGS: Bilaterally clear to auscultation, no rhonchi. ABDOMEN: Soft, nontender. No hepatomegaly clinically.  EXTREMITIES: No pedal edema.   LAB RESULTS:    Component Value Date/Time   NA 137 03/05/2014 0317   K 4.3 03/05/2014 0317   CL 103 03/05/2014 0317   CO2 29 03/05/2014 0317   GLUCOSE 132* 03/05/2014 0317   BUN 24* 03/05/2014 0317   CREATININE 1.08 03/06/2015 1636   CREATININE 1.35* 04/13/2014 1041   CALCIUM 9.2 03/05/2014 0317   PROT 7.0 03/06/2015 1636   PROT 6.8 04/13/2014 1041   ALBUMIN 3.6 03/06/2015 1636   ALBUMIN 3.5 04/13/2014 1041    AST 20 03/06/2015 1636   AST 15 04/13/2014 1041   ALT 17 03/06/2015 1636   ALT 19 04/13/2014 1041   ALKPHOS 46 03/06/2015 1636   ALKPHOS 52 04/13/2014 1041   BILITOT 0.2* 03/06/2015 1636   BILITOT 0.4 04/13/2014 1041   GFRNONAA >60 03/06/2015 1636   GFRNONAA 54* 04/13/2014 1041   GFRNONAA 50* 10/06/2013 1011   GFRAA >60 03/06/2015 1636   GFRAA >60 04/13/2014 1041   GFRAA 58* 10/06/2013 1011    Lab Results  Component Value Date   WBC 5.6 03/06/2015   NEUTROABS 4.5 03/06/2015   HGB 10.8* 03/06/2015   HCT 32.8* 03/06/2015   MCV 88.6 03/06/2015   PLT 150 03/06/2015    STUDIES: PET scan in April 2012 -  5.4 x 3.7 cm left axillary mass with a small focal hypermetabolic area within it. This is an area of recent surgical manipulation. Given the recent surgery this area may resent postsurgical changes versus  an area of malignancy. There are small areas of hypermetabolic activity within skin of the medial aspect of the lower left arm just above the elbow. This may be secondary to an infectious or inflammatory etiology although malignancy such as melanoma can have a similar appearance.   CT scan brain in April 2012 - negative for metastases.  02/25/15 - CT scan of abdomen. IMPRESSION:  1. Multiple hypo enhancing hepatic lesions highly worrisome for metastatic disease. 2. In this context, there is suspicion of a small lesion involving the pancreatic body with associated dilatation of the pancreatic duct, worrisome for primary pancreatic adenocarcinoma. 3. Abdominal MRI without and with contrast may be helpful for further evaluation. Tissue sampling is recommended. This may be best done using endoscopic ultrasound guidance to biopsy the pancreas. 4. Indeterminate small pulmonary nodule in the left lower lobe. 5. These results will be called to the ordering clinician or representative by the Radiologist Assistant, and communication documented in the PACS or zVision Dashboard.   ASSESSMENT /  PLAN:   1. Newly diagnosed pancreatic adenocarcinoma with likely small liver metastasis and small lung nodule left lower lobe (EUS at [facility] on 03/01/15 reported mass measuring 23.5 mm x 14.2 mm, biopsy was adenocarcinoma. EUS also reported single lesion in the left lobe of the liver suggestive of metastasis measuring 7 mm x 7.2 mm). On 02/25/15, CT scan of the abdomen reported multiple hypo enhancing hepatic lesions highly worrisome for metastatic disease, along with suspicion of a small lesion involving the pancreatic body with associated dilatation of the pancreatic duct, worrisome for primary pancreatic adenocarcinoma, and an indeterminate small pulmonary nodule in the left lower lobe  -  reviewed biopsy report, CT scan and discussed with patient. Have explained that he has pancreatic adenocarcinoma, that there are small lesions in the liver and lung which raises suspicion for metastatic disease although the largest lesion in the liver is only 10 mm. Plan therefore is to get a PET scan for more accurate staging of pancreatic cancer. Have explained to patient that if he does have metastatic disease then this would be stage IV pancreatic cancer which is incurable and treatments offered would be with palliative intent only, and have discussed general approach with chemotherapy. Also discussed overall prognosis in general which is poor. Patient states that he will not pursue stronger chemotherapy regimens like FOLFIRINOX but will consider taking milder regimen like Gemcitabine/Abraxane. He will return for follow-up early next week once PET scan is done to make further treatment planning. Have explained that he will need Port placement if we pursue chemotherapy. 2. h/o Tx N1 M0 (clinical, stage III) Malignant left axillary area malignant melanoma, s/p surgical resection x 2 (on 08/21/10 and 10/02/10) -   Have reviewed labs from today and d/w patient. He is overall doing steady without any clinical symptoms to suggest  recurrent or metastatic melanoma. No recurrent axillary adenopathy. Continue surveillance clinically.  3. Thrombocytopenia mild and intermittent - no bleeding symptoms.  Platelet count remains in low normal range today (was 139 in April 2012, 117 in May 2014 and is 152 today). Continue to monitor and consider further workup if counts worsen in the future.  4. Anemia - mild, nonspecific. Continue to monitor. 5. In between visits, he was advised to call or come to the ER in case of any new symptoms or sickness. He is agreeable to this plan.   Leia Alf, MD   03/06/2015 7:08 PM

## 2015-03-07 LAB — CANCER ANTIGEN 19-9: CA 19-9: 486 U/mL — ABNORMAL HIGH (ref 0–35)

## 2015-03-08 ENCOUNTER — Ambulatory Visit
Admission: RE | Admit: 2015-03-08 | Discharge: 2015-03-08 | Disposition: A | Discharge status: Home / Self Care | Payer: Medicare HMO | Source: Ambulatory Visit | Attending: Internal Medicine | Admitting: Internal Medicine

## 2015-03-08 DIAGNOSIS — C251 Malignant neoplasm of body of pancreas: Secondary | ICD-10-CM

## 2015-03-11 ENCOUNTER — Inpatient Hospital Stay: Payer: Medicare HMO | Admitting: Internal Medicine

## 2015-03-13 ENCOUNTER — Ambulatory Visit: Admission: RE | Admit: 2015-03-13 | Payer: Medicare HMO | Source: Ambulatory Visit

## 2015-03-14 ENCOUNTER — Inpatient Hospital Stay: Payer: Medicare HMO | Admitting: Internal Medicine

## 2015-03-14 ENCOUNTER — Ambulatory Visit
Admission: RE | Admit: 2015-03-14 | Discharge: 2015-03-14 | Disposition: A | Discharge status: Home / Self Care | Payer: Medicare HMO | Source: Ambulatory Visit | Attending: Internal Medicine | Admitting: Internal Medicine

## 2015-03-14 DIAGNOSIS — I700 Atherosclerosis of aorta: Secondary | ICD-10-CM | POA: Insufficient documentation

## 2015-03-14 DIAGNOSIS — C259 Malignant neoplasm of pancreas, unspecified: Secondary | ICD-10-CM | POA: Diagnosis not present

## 2015-03-14 LAB — GLUCOSE, CAPILLARY: Glucose-Capillary: 90 mg/dL (ref 65–99)

## 2015-03-14 MED ORDER — FLUDEOXYGLUCOSE F - 18 (FDG) INJECTION
12.4300 | Freq: Once | INTRAVENOUS | Status: DC | PRN
  Administered 2015-03-14: 12.43 via INTRAVENOUS
  Filled 2015-03-14: qty 12.43

## 2015-03-15 ENCOUNTER — Encounter: Payer: Self-pay | Admitting: *Deleted

## 2015-03-18 ENCOUNTER — Encounter: Payer: Self-pay | Admitting: Internal Medicine

## 2015-03-18 ENCOUNTER — Inpatient Hospital Stay (HOSPITAL_BASED_OUTPATIENT_CLINIC_OR_DEPARTMENT_OTHER): Payer: Medicare HMO | Admitting: Internal Medicine

## 2015-03-18 VITALS — BP 123/67 | HR 74 | Temp 97.7°F | Resp 18 | Ht 70.0 in | Wt 172.4 lb

## 2015-03-18 DIAGNOSIS — Z85820 Personal history of malignant melanoma of skin: Secondary | ICD-10-CM | POA: Diagnosis not present

## 2015-03-18 DIAGNOSIS — C259 Malignant neoplasm of pancreas, unspecified: Secondary | ICD-10-CM

## 2015-03-18 DIAGNOSIS — Z7901 Long term (current) use of anticoagulants: Secondary | ICD-10-CM

## 2015-03-18 DIAGNOSIS — C250 Malignant neoplasm of head of pancreas: Secondary | ICD-10-CM

## 2015-03-18 DIAGNOSIS — E785 Hyperlipidemia, unspecified: Secondary | ICD-10-CM

## 2015-03-18 DIAGNOSIS — C787 Secondary malignant neoplasm of liver and intrahepatic bile duct: Secondary | ICD-10-CM

## 2015-03-18 DIAGNOSIS — Z7984 Long term (current) use of oral hypoglycemic drugs: Secondary | ICD-10-CM

## 2015-03-18 DIAGNOSIS — R110 Nausea: Secondary | ICD-10-CM

## 2015-03-18 DIAGNOSIS — Z7982 Long term (current) use of aspirin: Secondary | ICD-10-CM

## 2015-03-18 DIAGNOSIS — I341 Nonrheumatic mitral (valve) prolapse: Secondary | ICD-10-CM

## 2015-03-18 DIAGNOSIS — K219 Gastro-esophageal reflux disease without esophagitis: Secondary | ICD-10-CM

## 2015-03-18 DIAGNOSIS — E118 Type 2 diabetes mellitus with unspecified complications: Secondary | ICD-10-CM

## 2015-03-18 DIAGNOSIS — I2510 Atherosclerotic heart disease of native coronary artery without angina pectoris: Secondary | ICD-10-CM

## 2015-03-18 NOTE — Progress Notes (Signed)
  Oncology Nurse Navigator Documentation    Navigator Encounter Type: Initial MedOnc (03/18/15 1200) Patient Visit Type: Medonc (03/18/15 1200)                    Time Spent with Patient: 60 (03/18/15 1200)   Met with Mr Boger and his daughter along with Dr Rogue Bussing.

## 2015-03-18 NOTE — Progress Notes (Signed)
Appetite has been dec since bx, nausea some and upper stomach pain but today is the best day since.  Last week when pt was seen he says he remembers it being  Wt 78.4 kg andit was documented at 98 kg. He feels like for over a month he has been around 172

## 2015-03-18 NOTE — Progress Notes (Signed)
[facility] OFFICE PROGRESS NOTE  Patient Care Team: Jodi Marble, MD as PCP - General (Internal Medicine)   SUMMARY OF ONCOLOGIC HISTORY:  # SEP 2016- PANCREATIC ADENO CA [T= 2.3CM; ? Liver met-47m EUS/ CT/PET; no Bx]; STAGE IV; Ca 19-9= 486  # 2012- MALIGNANT MELANOMA STAGE III-s/p Resection; No adj therapy.   #  DM on insulin  INTERVAL HISTORY:  A very pleasant 81year old male patient with above history of newly diagnosed pancreatic cancer incidentally [CT done for poorly controlled diabetes] noted to have pancreatic mass/also liver lesion on CT scan.  Patient had a subsequent endoscopic ultrasound with biopsy that confirmed the diagnosis of adenocarcinoma; and also showed the 7 mm liver lesion. This was too small to be biopsied.  Patient is here accompanied by his daughter to discuss the next plan of care for his newly diagnosed metastatic pancreas cancer.  Patient had mild to moderate intermittent nausea without vomiting ever since he had the EUS. He denies any pain. Currently this is improved.  REVIEW OF SYSTEMS:  A complete 10 point review of system is done which is negative except mentioned above/history of present illness.   PAST MEDICAL HISTORY :  Past Medical History  Diagnosis Date  . 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 Diabetes mellitus without complication ([facility]   . Hypertension   . GERD (gastroesophageal reflux disease)   . Coronary artery disease   . Hyperlipidemia   . Mitral valve prolapse   . Cataracts, bilateral   . History of kidney stones   . Fungal infection of toenail      great toenail  . Pancreatic cancer ([facility] 03[place]    PAST SURGICAL HISTORY :   Past Surgical History  Procedure Laterality Date  . Skin cancer excision    . Cholecystectomy      FAMILY HISTORY :  No family history on file.  SOCIAL  HISTORY:   Social History  Substance Use Topics  . Smoking status: Never Smoker   . Smokeless tobacco: Never Used  . Alcohol Use: No    ALLERGIES:  is allergic to dulaglutide; penicillins; and warfarin sodium.  MEDICATIONS:  Current Outpatient Prescriptions  Medication Sig Dispense Refill  . acetaminophen (TYLENOL) 325 MG tablet     . apixaban (ELIQUIS) 5 MG TABS tablet Take 5 mg by mouth 2 (two) times daily.    .Marland Kitchenaspirin EC 81 MG tablet     . carvedilol (COREG) 6.25 MG tablet twice a day    . Cholecalciferol (VITAMIN D-1000 MAX ST) 1000 UNITS tablet Take by mouth.    . diclofenac sodium (VOLTAREN) 1 % GEL Apply topically.    . enalapril (VASOTEC) 20 MG tablet twice a day    . fenofibrate 160 MG tablet once a day    . fluticasone (FLONASE) 50 MCG/ACT nasal spray Place into the nose.    .Marland KitchenglipiZIDE (GLUCOTROL) 10 MG tablet twice a day    . hydrochlorothiazide (MICROZIDE) 12.5 MG capsule     . Multiple Vitamins-Minerals (MULTIVITAMIN WITH MINERALS) tablet     . nitroGLYCERIN (NITROSTAT) 0.3 MG SL tablet use as directed    . ranitidine (ZANTAC) 150 MG tablet twice a day    . simvastatin (ZOCOR) 20 MG tablet     . sitaGLIPtin-metformin (JANUMET) 50-1000 MG per tablet twice a day    . traMADol (ULTRAM) 50  MG tablet 1 every 6 hours    . vitamin E 400 UNIT capsule Take by mouth.     No current facility-administered medications for this visit.   Facility-Administered Medications Ordered in Other Visits  Medication Dose Route Frequency Provider Last Rate Last Dose  . fludeoxyglucose F - 18 (FDG) injection 12.43 milli Curie  12.43 milli Curie Intravenous Once PRN Medication Radiologist, MD   12.43 milli Curie at 03/14/15 1338    PHYSICAL EXAMINATION: ECOG PERFORMANCE STATUS: 1 - Symptomatic but completely ambulatory  BP 123/67 mmHg  Pulse 74  Temp(Src) 97.7 F (36.5 C) (Oral)  Resp 18  Ht 5' 10"  (1.778 m)  Wt 172 lb 6.4 oz (78.2 kg)  BMI 24.74 kg/m2  Filed Weights   03/18/15  1133 03/18/15 1140  Weight: 172 lb 6.4 oz (78.2 kg) 172 lb 6.4 oz (78.2 kg)    GENERAL: Well-nourished well-developed; Alert, no distress and comfortable.   Accompanied by his daughter. Detailed exam not done today. He walks with a cane. LABORATORY DATA:  I have reviewed the data as listed    Component Value Date/Time   NA 137 03/05/2014 0317   K 4.3 03/05/2014 0317   CL 103 03/05/2014 0317   CO2 29 03/05/2014 0317   GLUCOSE 132* 03/05/2014 0317   BUN 24* 03/05/2014 0317   CREATININE 1.08 03/06/2015 1636   CREATININE 1.35* 04/13/2014 1041   CALCIUM 9.2 03/05/2014 0317   PROT 7.0 03/06/2015 1636   PROT 6.8 04/13/2014 1041   ALBUMIN 3.6 03/06/2015 1636   ALBUMIN 3.5 04/13/2014 1041   AST 20 03/06/2015 1636   AST 15 04/13/2014 1041   ALT 17 03/06/2015 1636   ALT 19 04/13/2014 1041   ALKPHOS 46 03/06/2015 1636   ALKPHOS 52 04/13/2014 1041   BILITOT 0.2* 03/06/2015 1636   BILITOT 0.4 04/13/2014 1041   GFRNONAA >60 03/06/2015 1636   GFRNONAA 54* 04/13/2014 1041   GFRNONAA 50* 10/06/2013 1011   GFRAA >60 03/06/2015 1636   GFRAA >60 04/13/2014 1041   GFRAA 58* 10/06/2013 1011    No results found for: SPEP, UPEP  Lab Results  Component Value Date   WBC 5.6 03/06/2015   NEUTROABS 4.5 03/06/2015   HGB 10.8* 03/06/2015   HCT 32.8* 03/06/2015   MCV 88.6 03/06/2015   PLT 150 03/06/2015      Chemistry      Component Value Date/Time   NA 137 03/05/2014 0317   K 4.3 03/05/2014 0317   CL 103 03/05/2014 0317   CO2 29 03/05/2014 0317   BUN 24* 03/05/2014 0317   CREATININE 1.08 03/06/2015 1636   CREATININE 1.35* 04/13/2014 1041      Component Value Date/Time   CALCIUM 9.2 03/05/2014 0317   ALKPHOS 46 03/06/2015 1636   ALKPHOS 52 04/13/2014 1041   AST 20 03/06/2015 1636   AST 15 04/13/2014 1041   ALT 17 03/06/2015 1636   ALT 19 04/13/2014 1041   BILITOT 0.2* 03/06/2015 1636   BILITOT 0.4 04/13/2014 1041       RADIOGRAPHIC STUDIES: I have personally reviewed the  radiological images as listed and agreed with the findings in the report. No results found.   ASSESSMENT & PLAN:   # Metastatic adenocarcinoma of pancreas to liver [EUS with biopsy of the pancreatic head lesion; liver lesion based on imaging/EUS]. I reviewed the PET scan myself; reviewed with the patient and his daughter in detail.  Long discussion with the patient and his daughter regarding the  incurable nature of stage IV pancreatic malignancy. Palliative chemotherapy could be recommended to slow down the growth of the disease. I went over the treatment regimens including-single agent gemcitabine 10% response rate; gemcitabine-Abraxane 20% response rate; FOLFIRINOX ~30% response rate. There is small incremental improvement in response rates; however the toxicities are significant especially with the latter option.  # Discussed the potential side effects including but not limited to-increasing fatigue, nausea vomiting, diarrhea, sores in the mouth, increase risk of infection and also neuropathy.   I personally do not think patient is a candidate for very aggressive chemotherapy. After a long discussion patient seems to lean towards treatment; however has not made up his mind. He wants to talk to his family and let us know. In either case I would recommend that patient follows up with me in approximately 2-3 weeks to make final decisions.  # He will need a port. He'll call us and let us know as soon as possible- and if his interested in treatment the plan is to start on gemcitabine or gemcitabine and Abraxane.  # [place] patient's history of melanoma and pancreas cancer-candidate for BRCA testing. No immediate family members with any malignancy including his 3 daughters who are in the 21s to 17s. I reviewed the testing; potential health implications for the rest of the family; and also implications regarding life/disability insurance etc. Patient has not decided; patient will let us know if  interested  in pursuing the testing.    No orders of the defined types were placed in this encounter.   All questions were answered. The patient knows to call the clinic with any problems, questions or concerns. No barriers to learning was detected. I spent 30 minutes counseling the patient face to face. The total time spent in the appointment was 40 minutes and more than 50% was on counseling and review of test results     Cammie Sickle, MD 03/18/2015 11:43 AM

## 2015-03-18 NOTE — Patient Instructions (Signed)
Pancreatic Cancer  Pancreatic cancer is an abnormal growth of tissue (tumor) in the pancreas that is cancerous (malignant). Unlike noncancerous (benign) tumors, malignant tumors can spread to other parts of your body. The pancreas is a gland located deep in the abdomen, between the stomach and the spine. The pancreas makes insulin and other hormones. These hormones help the body use or store the energy that comes from food. The pancreas also makes pancreatic juices. These juices contain enzymes that help digest food. Most pancreatic cancers begin in the ducts that carry pancreatic juices. When cancer of the pancreas spreads (metastasizes) outside the pancreas, cancer cells are often found in nearby lymph nodes. If the cancer has reached these nodes, it means that cancer cells may have spread to other lymph nodes or other tissues, such as the liver or lungs. Sometimes cancer of the pancreas spreads to the peritoneum. This is the tissue that lines the abdomen. CAUSES  The exact cause of pancreatic cancer is unknown.  RISK FACTORS There are a number of risk factors that can increase your chances of getting pancreatic cancer. They include:  Age. The likelihood of developing pancreatic cancer increases with age. Most pancreatic cancers occur in people older than 60 years.  Smoking. Cigarette smokers are 2 to 3 times more likely than nonsmokers to develop pancreatic cancer.  Diabetes, especially if you were diagnosed as an adult.  Being male.  Being African American.  Family history. The risk for developing pancreatic cancer triples if a person's mother, father, sister, or brother had the disease. Also, a family history of colon cancer or ovarian cancer increases the risk of pancreatic cancer.  Chronic pancreatitis. Chronic pancreatitis is a painful condition of the pancreas.  Exposure to certain chemicals in the workplace.  Being obese or eating a diet high in fat and red meat. SYMPTOMS    Pancreatic cancer is sometimes called a "silent disease." This is because early pancreatic cancer often does not cause symptoms. As the cancer grows, symptoms may include:  Weakness.  Abdominal pain.  Diarrhea.  Depression.  Loss of appetite.  Indigestion.  Pain in the upper abdomen or upper back.  Nausea and vomiting.  Yellowing of the skin or eyes (jaundice).  Back pain.  Weight loss.  Fatigue.  Clay-colored stools.  Unexplained blood clots.  Dark urine. DIAGNOSIS  Your caregiver will ask about your medical history. He or she may also perform a number of procedures, such as:  A physical exam. Your skin and eyes will be examined for signs of jaundice. The abdomen will be checked for changes in the area near the pancreas, liver, and gallbladder. Your caregiver will also check for an abnormal buildup of fluid in the abdomen (ascites).  Lab tests. Your caregiver may take blood and urine samples to check for bilirubin and other substances. Bilirubin is a substance that passes from the liver to the intestine through the gallbladder and bile duct. If the bile duct is blocked by a tumor, the bilirubin cannot pass through normally. Blockage of the bile duct may cause the level of bilirubin in the blood and urine to become very high.  Computed tomography (CT). An X-ray machine linked to a computer takes a series of detailed pictures of the pancreas and other organs and blood vessels in the abdomen.  Ultrasonography. The ultrasound device uses sound waves to produce pictures of the pancreas and other organs inside the abdomen.  Endoscopic retrograde cholangiopancreatography. A lighted tube (endoscope) is passed through the patient's  mouth and stomach, down into the small intestine. Then a smaller tube (catheter) is inserted through the endoscope into the bile ducts and pancreatic ducts. A dye is injected through the catheter into the ducts and X-rays are taken. The X-rays can show  whether the ducts are narrowed or blocked by a tumor.  Endoscopic ultrasonography. An endoscope is passed through the patient's mouth and stomach, down into the small intestine. An ultrasound device is placed down the endoscope to produce pictures of the area, including the pancreas.  Percutaneous transhepatic cholangiography. A dye is injected through a thin needle into the liver and X-rays are taken. Unless there is a blockage, the dye should move freely through the bile ducts. From the X-rays, your caregiver can tell whether there is a blockage from a tumor.  Taking a tissue sample (biopsy) from the pancreas. The sample is examined under a microscope to look for cancer cells. Your cancer will be staged according to its severity and extent. Staging is a careful attempt to categorize your cancer to help determine which treatment will be most appropriate. Factors involved in staging include the size of the tumor, whether the cancer has spread, and if so, to what parts of the body it has spread. You may need to have more tests to determine the stage of your cancer. The test results will help determine what treatment plan is best for you. STAGES   Stage I. The cancer is only found in the pancreas.  Stage II. The cancer has spread to nearby tissues and possibly to the lymph nodes, but not to the blood vessels.  Stage III. The cancer is surrounding the major blood vessels beside the pancreas and has possibly spread to the lymph nodes.  Stage IV. The cancer has spread to other parts of the body, such as the liver, lungs, or peritoneum. TREATMENT  Treatment generally begins within several weeks after the diagnosis. There will be time to talk with your caregiver about treatment choices, get a second opinion, and learn more about the disease. Your caregiver may refer you to a cancer specialist (oncologist).  Cancer of the pancreas is very hard to control with current treatments. For that reason, many  caregivers encourage patients with this disease to consider taking part in a clinical trial. Clinical trials are an important option for people with all stages of pancreatic cancer.  At this time, pancreatic cancer can be cured only when it is found at an early stage, before it has spread. However, other treatments may be able to control the disease and help patients live longer and feel better. When a cure or control of the disease is not possible, some patients choose palliative therapy. Palliative therapy aims to improve quality of life by controlling pain and other problems caused by this disease, but it does not cure the disease. Depending on the type and stage, pancreatic cancer may be treated with surgery, radiation therapy, or chemotherapy. Some patients have a combination of these therapies.  Surgery may be done to remove all or part of the pancreas. Sometimes the cancer cannot be completely removed. However, if the tumor is blocking the common bile duct or duodenum, the surgeon can place a mesh tube (stent) in the blocked area. This helps keep the duct or duodenum open.  Radiation therapy uses high-energy rays to kill cancer cells.  Chemotherapy is the use of drugs to kill cancer cells. Caregivers also give chemotherapy to help reduce pain and other problems caused by pancreatic  cancer. HOME CARE INSTRUCTIONS   Only take over-the-counter or prescription medicines for pain, discomfort, or fever as directed by your caregiver.  Maintain a healthy diet.  Consider joining a support group. This may help you learn to cope with the stress of having pancreatic cancer.  Seek advice to help you manage treatment side effects.  Keep all follow-up appointments as directed by your caregiver. SEEK IMMEDIATE MEDICAL CARE IF:   You have a sudden increase in pain.  Your skin or eyes turn more yellow.  You lose weight without trying.  You have a fever, especially during chemotherapy treatment or  after stent placement.  You notice new fatigue or weakness.   This information is not intended to replace advice given to you by your health care provider. Make sure you discuss any questions you have with your health care provider.   Document Released: 05/02/2004 Document Revised: 06/08/2014 Document Reviewed: 06/12/2011 Elsevier Interactive Patient Education [employer].

## 2015-03-22 ENCOUNTER — Ambulatory Visit: Payer: Medicare HMO | Admitting: Internal Medicine

## 2015-03-25 ENCOUNTER — Telehealth: Payer: Self-pay

## 2015-03-25 NOTE — Telephone Encounter (Signed)
  Oncology Nurse Navigator Documentation    Navigator Encounter Type: Telephone (03/25/15 1000) Patient Visit Type: Follow-up (03/25/15 1000)       Interventions: Coordination of Care (03/25/15 1000)   Coordination of Care: MD Appointments (03/25/15 1000)        Time Spent with Patient: 15 (03/25/15 1000)   Received call from Mr Deroo. He states that after lengthy conversations with his family he has decided not to received chemotherapy. Asked questions regarding things that he can look for with pancreatic cancer. Educated further on advacning pancreatic cancer. He does want to continue and it is encouraged for him to continue to see Med Onc for his disease. Will Notify Dr Rogue Bussing and schedule him a follow up in 3-4 weeks.

## 2015-04-05 ENCOUNTER — Telehealth: Payer: Self-pay | Admitting: *Deleted

## 2015-04-05 NOTE — Telephone Encounter (Signed)
msg left again on 11/2 for daughter Charlett Nose, who lives in [place]. Daughter sent FMLA papers to the practice for Dr. Rogue Bussing. The FMLA papers were faxed on 03/18/15 to our office. However, we have been unable to complete these forms. I need to know whether daughter is requesting intermittent or continuous leave. Multiple messages have been left for the daughter by Moishe Spice, RN and Renita Papa, RN over the last week. Previous attempts by Renita Papa, RN to reach daughter were performed on 03/18/15 & 03/20/15.  Ms. Jannetta Quint has not contacted our office back.  On clinical note, the patient did elect not to receive chemotherapy; however prefers that he still come in for routine office visits.  His daughter, Cecille Rubin, will be transporting him to these appointments. In order to accurately complete these forms, the daughter, Charlett Nose, must return the office call.  msg left again 04/05/15 at 851. No answer.

## 2015-04-15 ENCOUNTER — Inpatient Hospital Stay: Payer: Medicare HMO | Attending: Internal Medicine | Admitting: Internal Medicine

## 2015-04-15 VITALS — BP 151/65 | HR 80 | Temp 95.0°F | Resp 18 | Ht 70.0 in | Wt 174.8 lb

## 2015-04-15 DIAGNOSIS — I10 Essential (primary) hypertension: Secondary | ICD-10-CM | POA: Diagnosis not present

## 2015-04-15 DIAGNOSIS — Z7901 Long term (current) use of anticoagulants: Secondary | ICD-10-CM | POA: Diagnosis not present

## 2015-04-15 DIAGNOSIS — C250 Malignant neoplasm of head of pancreas: Secondary | ICD-10-CM | POA: Diagnosis not present

## 2015-04-15 DIAGNOSIS — Z79899 Other long term (current) drug therapy: Secondary | ICD-10-CM | POA: Insufficient documentation

## 2015-04-15 DIAGNOSIS — E785 Hyperlipidemia, unspecified: Secondary | ICD-10-CM

## 2015-04-15 DIAGNOSIS — Z85820 Personal history of malignant melanoma of skin: Secondary | ICD-10-CM | POA: Diagnosis not present

## 2015-04-15 DIAGNOSIS — C787 Secondary malignant neoplasm of liver and intrahepatic bile duct: Secondary | ICD-10-CM | POA: Insufficient documentation

## 2015-04-15 DIAGNOSIS — Z7982 Long term (current) use of aspirin: Secondary | ICD-10-CM | POA: Diagnosis not present

## 2015-04-15 DIAGNOSIS — M1990 Unspecified osteoarthritis, unspecified site: Secondary | ICD-10-CM | POA: Insufficient documentation

## 2015-04-15 DIAGNOSIS — K219 Gastro-esophageal reflux disease without esophagitis: Secondary | ICD-10-CM | POA: Insufficient documentation

## 2015-04-15 DIAGNOSIS — K5900 Constipation, unspecified: Secondary | ICD-10-CM | POA: Insufficient documentation

## 2015-04-15 DIAGNOSIS — I341 Nonrheumatic mitral (valve) prolapse: Secondary | ICD-10-CM | POA: Diagnosis not present

## 2015-04-15 DIAGNOSIS — I2510 Atherosclerotic heart disease of native coronary artery without angina pectoris: Secondary | ICD-10-CM

## 2015-04-15 DIAGNOSIS — C259 Malignant neoplasm of pancreas, unspecified: Secondary | ICD-10-CM

## 2015-04-15 NOTE — Progress Notes (Signed)
[facility] OFFICE PROGRESS NOTE  Patient Care Team: Jodi Marble, MD as PCP - General (Internal Medicine)   SUMMARY OF ONCOLOGIC HISTORY:  # SEP 2016- PANCREATIC ADENO CA [T= 2.3CM; ? Liver met-48m EUS/ CT/PET; no Bx]; STAGE IV; Ca 19-9= 486  # 2012- MALIGNANT MELANOMA STAGE III-s/p Resection; No adj therapy.   #  DM on insulin  INTERVAL HISTORY:  A very pleasant 81year old male patient with above history of newly diagnosed stage IV pancreatic cancer with metastasis to the liver. After my last visit approximately 3 weeks ago/ patient decided against chemotherapy.  Patient is here accompanied by his daughter to discuss regarding hospice/Plan.  He complains of intermittent constipation no blood in stools. He has been taking MiraLAX every other day. His appetite is fair at best. Otherwise no nausea no vomiting. His chronic arthritic pain for which is on tramadol for many years. Denies any new onset of pain.  REVIEW OF SYSTEMS:  A complete 10 point review of system is done which is negative except mentioned above/history of present illness.   PAST MEDICAL HISTORY :  Past Medical History  Diagnosis Date  . 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 Diabetes mellitus without complication ([place]   . Hypertension   . GERD (gastroesophageal reflux disease)   . Coronary artery disease   . Hyperlipidemia   . Mitral valve prolapse   . Cataracts, bilateral   . History of kidney stones   . Fungal infection of toenail      great toenail  . Pancreatic cancer ([facility] 03[place]2015  . Mitral valve prolapse   . Arthritis     PAST SURGICAL HISTORY :   Past Surgical History  Procedure Laterality Date  . Skin cancer excision    . Cholecystectomy    . Upper esophageal endoscopic ultrasound (eus)  03/2015    FAMILY HISTORY :   Family History  Problem Relation  Age of Onset  . Aneurysm Mother   . COPD Father     SOCIAL HISTORY:   Social History  Substance Use Topics  . Smoking status: Never Smoker   . Smokeless tobacco: Never Used  . Alcohol Use: No    ALLERGIES:  is allergic to dulaglutide; penicillins; and warfarin sodium.  MEDICATIONS:  Current Outpatient Prescriptions  Medication Sig Dispense Refill  . acetaminophen (TYLENOL) 325 MG tablet     . acetaminophen (TYLENOL) 325 MG tablet Take by mouth.    .Marland Kitchenapixaban (ELIQUIS) 5 MG TABS tablet Take 5 mg by mouth 2 (two) times daily.    .Marland Kitchenapixaban (ELIQUIS) 5 MG TABS tablet Take 1 Dose by mouth 2 (two) times daily. 1/2 pill bid    . aspirin EC 81 MG tablet     . aspirin EC 81 MG tablet Take by mouth.    . carvedilol (COREG) 6.25 MG tablet twice a day    . carvedilol (COREG) 6.25 MG tablet Take by mouth.    . cholecalciferol (VITAMIN D) 400 UNITS TABS tablet Take 1 tablet by mouth 2 (two) times daily.    . Cholecalciferol (VITAMIN D-1000 MAX ST) 1000 UNITS tablet Take by mouth.    . Coenzyme Q10 50 MG TABS Take by mouth.    . diclofenac sodium (VOLTAREN) 1 % GEL Apply topically.    . enalapril (VASOTEC) 20 MG tablet twice a day    .  enalapril (VASOTEC) 20 MG tablet Take by mouth.    . fenofibrate 160 MG tablet once a day    . fenofibrate 160 MG tablet Take by mouth.    . fluticasone (FLONASE) 50 MCG/ACT nasal spray Place into the nose.    Marland Kitchen glipiZIDE (GLUCOTROL XL) 2.5 MG 24 hr tablet Take 10 mg by mouth 2 (two) times daily.    Marland Kitchen glipiZIDE (GLUCOTROL) 10 MG tablet twice a day    . hydrochlorothiazide (MICROZIDE) 12.5 MG capsule     . hydrochlorothiazide (MICROZIDE) 12.5 MG capsule Take by mouth.    . Insulin Glargine (TOUJEO SOLOSTAR) 300 UNIT/ML SOPN Inject into the skin.    . metFORMIN (GLUMETZA) 500 MG (MOD) 24 hr tablet Take by mouth.    . Multiple Vitamins-Minerals (MULTIVITAMIN WITH MINERALS) tablet     . Multiple Vitamins-Minerals (MULTIVITAMIN WITH MINERALS) tablet Take by  mouth.    . nitroGLYCERIN (NITROSTAT) 0.3 MG SL tablet use as directed    . ranitidine (ZANTAC) 150 MG tablet twice a day    . ranitidine (ZANTAC) 150 MG tablet Take by mouth.    . simvastatin (ZOCOR) 20 MG tablet     . simvastatin (ZOCOR) 20 MG tablet Take by mouth.    . sitaGLIPtin-metformin (JANUMET) 50-1000 MG per tablet twice a day    . traMADol (ULTRAM) 50 MG tablet 1 every 6 hours    . traMADol (ULTRAM) 50 MG tablet Take by mouth.    . vitamin E 400 UNIT capsule Take by mouth.    . vitamin E 400 UNIT capsule Take by mouth.     No current facility-administered medications for this visit.    PHYSICAL EXAMINATION: ECOG PERFORMANCE STATUS: 1 - Symptomatic but completely ambulatory  BP 151/65 mmHg  Pulse 80  Temp(Src) 95 F (35 C) (Tympanic)  Resp 18  Ht 5' 10"  (1.778 m)  Wt 174 lb 13.2 oz (79.3 kg)  BMI 25.08 kg/m2  Filed Weights   04/15/15 1120  Weight: 174 lb 13.2 oz (79.3 kg)    GENERAL: Well-nourished well-developed; Alert, no distress and comfortable.   Accompanied by his daughter/wife. Detailed exam not done today. He walks with a cane. LABORATORY DATA:  I have reviewed the data as listed    Component Value Date/Time   NA 137 03/05/2014 0317   K 4.3 03/05/2014 0317   CL 103 03/05/2014 0317   CO2 29 03/05/2014 0317   GLUCOSE 132* 03/05/2014 0317   BUN 24* 03/05/2014 0317   CREATININE 1.08 03/06/2015 1636   CREATININE 1.35* 04/13/2014 1041   CALCIUM 9.2 03/05/2014 0317   PROT 7.0 03/06/2015 1636   PROT 6.8 04/13/2014 1041   ALBUMIN 3.6 03/06/2015 1636   ALBUMIN 3.5 04/13/2014 1041   AST 20 03/06/2015 1636   AST 15 04/13/2014 1041   ALT 17 03/06/2015 1636   ALT 19 04/13/2014 1041   ALKPHOS 46 03/06/2015 1636   ALKPHOS 52 04/13/2014 1041   BILITOT 0.2* 03/06/2015 1636   BILITOT 0.4 04/13/2014 1041   GFRNONAA >60 03/06/2015 1636   GFRNONAA 54* 04/13/2014 1041   GFRNONAA 50* 10/06/2013 1011   GFRAA >60 03/06/2015 1636   GFRAA >60 04/13/2014 1041    GFRAA 58* 10/06/2013 1011    No results found for: SPEP, UPEP  Lab Results  Component Value Date   WBC 5.6 03/06/2015   NEUTROABS 4.5 03/06/2015   HGB 10.8* 03/06/2015   HCT 32.8* 03/06/2015   MCV 88.6 03/06/2015   PLT  150 03/06/2015      Chemistry      Component Value Date/Time   NA 137 03/05/2014 0317   K 4.3 03/05/2014 0317   CL 103 03/05/2014 0317   CO2 29 03/05/2014 0317   BUN 24* 03/05/2014 0317   CREATININE 1.08 03/06/2015 1636   CREATININE 1.35* 04/13/2014 1041      Component Value Date/Time   CALCIUM 9.2 03/05/2014 0317   ALKPHOS 46 03/06/2015 1636   ALKPHOS 52 04/13/2014 1041   AST 20 03/06/2015 1636   AST 15 04/13/2014 1041   ALT 17 03/06/2015 1636   ALT 19 04/13/2014 1041   BILITOT 0.2* 03/06/2015 1636   BILITOT 0.4 04/13/2014 1041       RADIOGRAPHIC STUDIES: I have personally reviewed the radiological images as listed and agreed with the findings in the report. No results found.   ASSESSMENT & PLAN:  # Stage IV/ Metastatic adenocarcinoma of pancreas to liver [EUS with biopsy of the pancreatic head lesion; liver lesion based on imaging/EUS]. After the lengthy discussion at last visit/approximately 2 weeks ago- patient and family finally decided not to proceed with any chemotherapy. Today also the stand by their decision.  # I discussed the role of hospice with the patient and family in detail. Discussed the hospice philosophy- they agree.   # Constipation- recommend MiraLAX every day; also Dulcolax as needed.  # I left a voicemail for Dr.Tejan-Sie to discuss the above.   All questions were answered. The patient knows to call the clinic with any problems, questions or concerns. No barriers to learning was detected.  I spent 25  minutes counseling the patient face to face. The total time spent in the appointment was 30 minutes and more than 50% was on counseling and review of test results     Cammie Sickle, MD 04/15/2015 11:49 AM

## 2015-04-15 NOTE — Progress Notes (Signed)
Patient is here for follow-up of pancreatic cancer. He states that he has been having nausea. Patient also states that his appetite has not been that good. He eats a little bit and then is not hungry anymore.

## 2015-04-18 ENCOUNTER — Encounter: Payer: Self-pay | Admitting: *Deleted

## 2015-04-18 NOTE — Progress Notes (Signed)
The FMLA forms for daughter, Renaye Rakers, were completed and faxed on 04/17/15 to [phone].

## 2015-04-22 ENCOUNTER — Other Ambulatory Visit: Payer: Self-pay | Admitting: Internal Medicine

## 2015-04-25 ENCOUNTER — Telehealth: Payer: Self-pay | Admitting: Hematology and Oncology

## 2015-04-25 NOTE — Telephone Encounter (Signed)
Re:  Decline in health  The patient has metastatic pancreatic cancer. Notes reveal that he has declined chemotherapy. Hospice has been discussed.   The hospice nurse came out to the house today.  He fell.  EMS was called and evaluated the patient.  He declined evaluation in the emergency room. Subsequently he has had slurred speech and is sleeping more. Family wishes him to transfer to the hospice home rather than to the emergency room at [facility].   I stated that if it is the patient's wishes that oncology was in agreement. If he cannot speak for himself and if his medical power of attorney was in agreement with transfer to the hospice home, that medical oncology was in agreement.  Inez Catalina stated that this would be confirmed.  Lequita Asal, MD

## 2015-04-27 ENCOUNTER — Other Ambulatory Visit: Payer: Self-pay | Admitting: Hematology and Oncology

## 2015-05-01 ENCOUNTER — Telehealth: Payer: Self-pay | Admitting: *Deleted

## 2015-05-01 ENCOUNTER — Encounter: Payer: Self-pay | Admitting: *Deleted

## 2015-05-01 MED ORDER — OXYCODONE HCL 5 MG PO TABS: 5.0000 mg | ORAL_TABLET | ORAL | Status: AC | PRN

## 2015-05-01 NOTE — Telephone Encounter (Signed)
I called and asked who had ordered Oxycodone as we did not have it on his med list, She stated Dr Eustaquio Maize, Med Director  had ordered it and she will contact her to see if she will fill it. If not, she will call us back

## 2015-05-01 NOTE — Telephone Encounter (Signed)
faxed

## 2015-06-02 DEATH — deceased

## 2015-08-14 ENCOUNTER — Ambulatory Visit: Payer: Medicare HMO | Admitting: Internal Medicine

## 2015-08-14 ENCOUNTER — Other Ambulatory Visit: Payer: Medicare HMO

## 2016-04-04 IMAGING — CT CT ABDOMEN WO/W CM
2 of 7 series · 13 of 32 positions shown, 18 images · IV contrast (omnipaque)
Comparison: Chest CT 03/04/2011.  PET-CT 09/18/2010

CLINICAL DATA: Hyperglycemia with uncontrolled diabetes type 2.
Evaluate for pancreatic cancer. History of gallbladder surgery.
Initial encounter.

EXAM:
CT ABDOMEN WITHOUT AND WITH CONTRAST
TECHNIQUE: Multidetector CT imaging of the abdomen was performed following the
standard protocol before and following the bolus administration of
intravenous contrast.
CONTRAST:  100mL OMNIPAQUE IOHEXOL 300 MG/ML  SOLN

[Series 6: pancreas arterial thins · axial · arterial · 0.68mm/px · z∈[-517,-271]mm · 8 of 302 slices shown]
[im 28/302  soft-tissue]
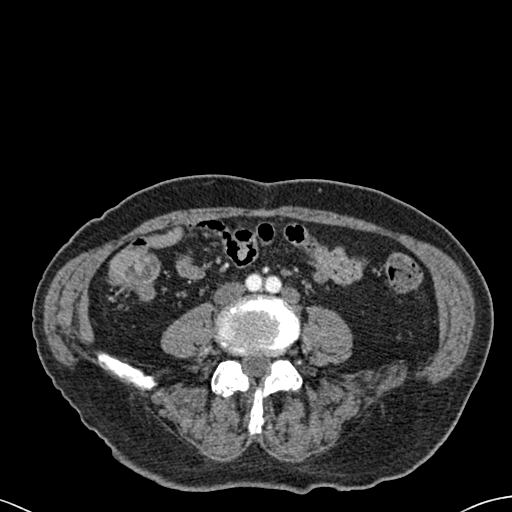
[im 55/302  soft-tissue]
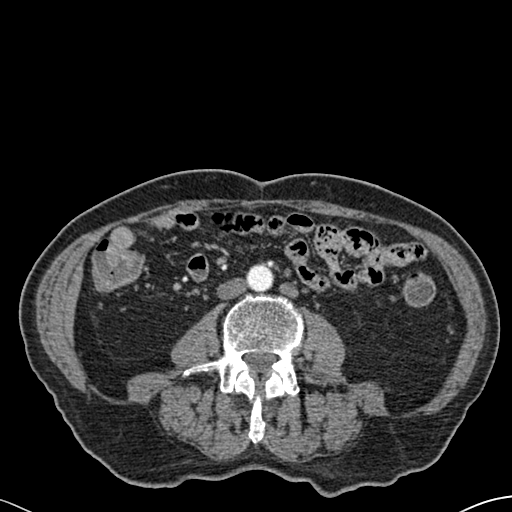
[im 110/302  soft-tissue]
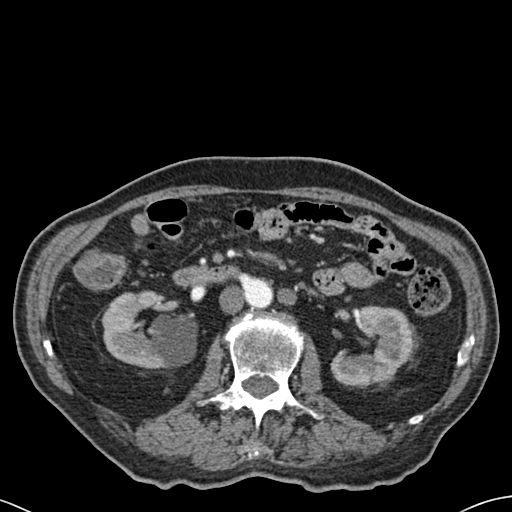
[im 137/302  soft-tissue]
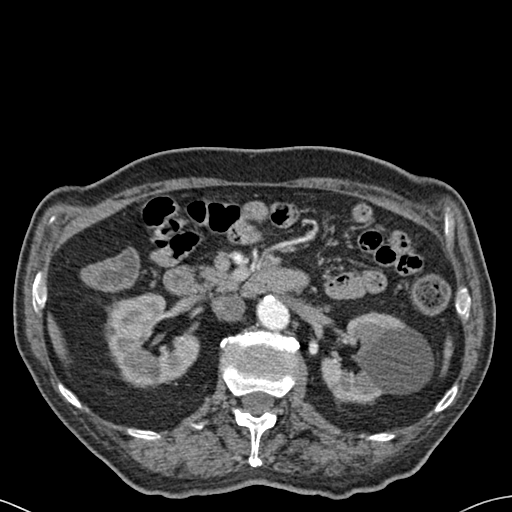
[im 165/302  soft-tissue]
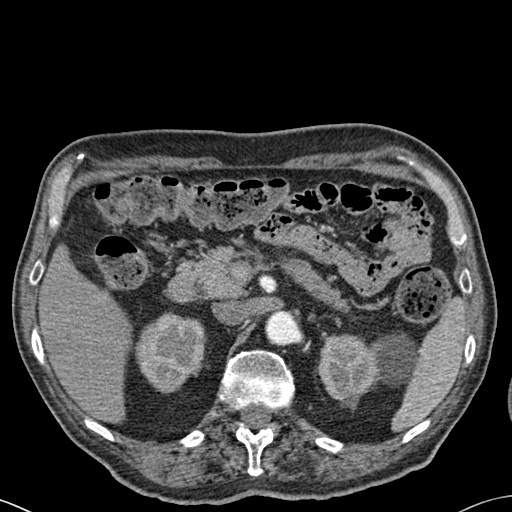
[im 192/302  soft-tissue]
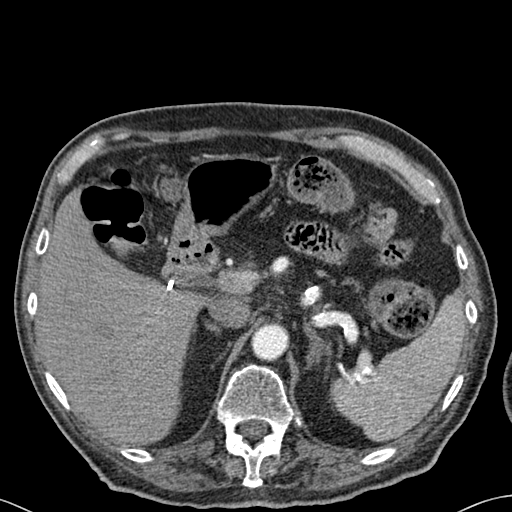
[im 247/302  soft-tissue]
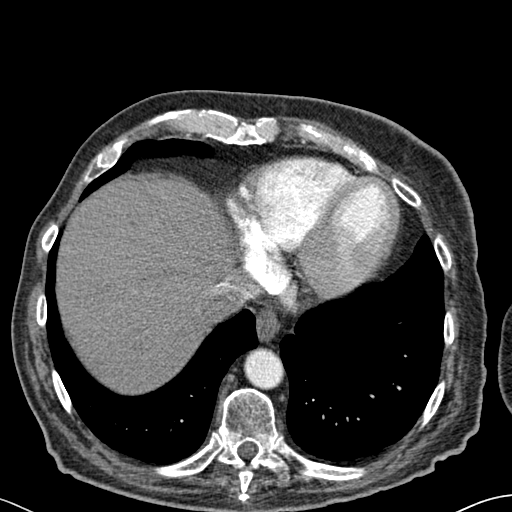
[im 274/302  soft-tissue]
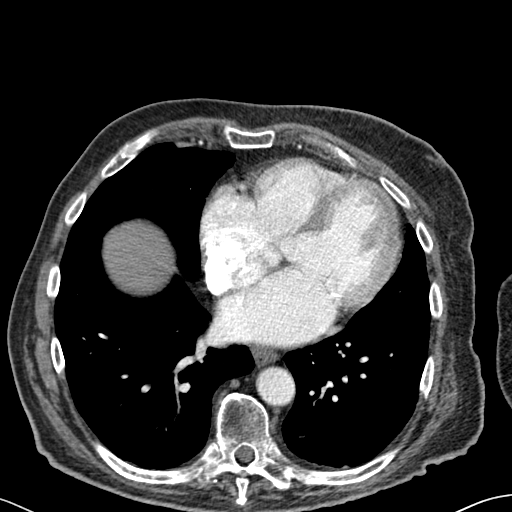

[Series 9: pancreas venous thins · axial · portal-venous · 0.68mm/px · z∈[-490,-298]mm · 5 of 192 slices shown, 10 images]
[im 32/192  soft-tissue]
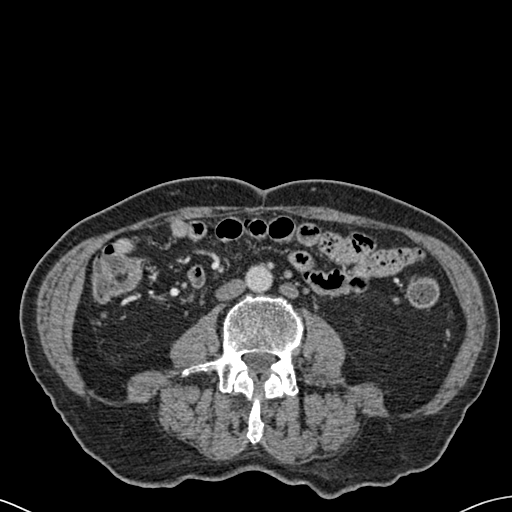
[im 32/192  bone]
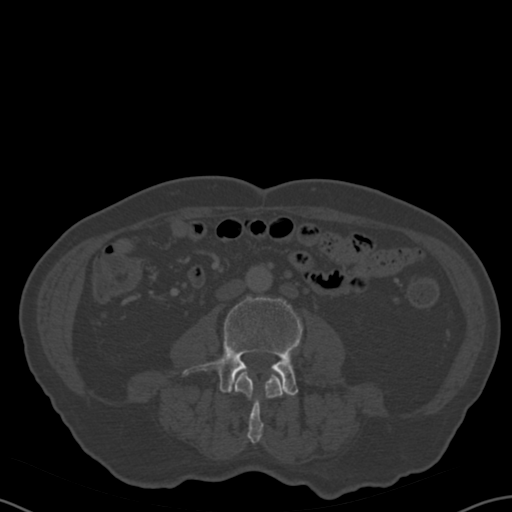
[im 64/192  soft-tissue]
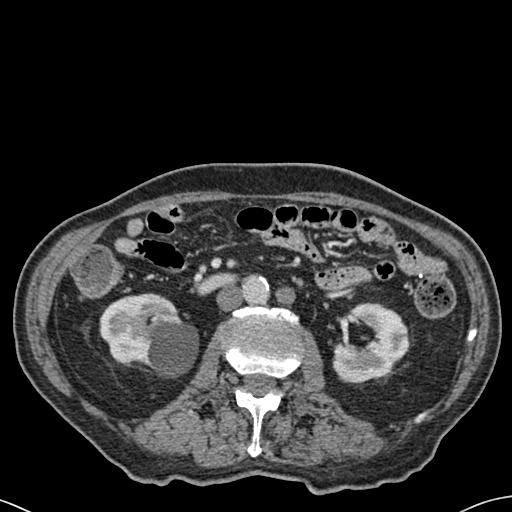
[im 64/192  lung]
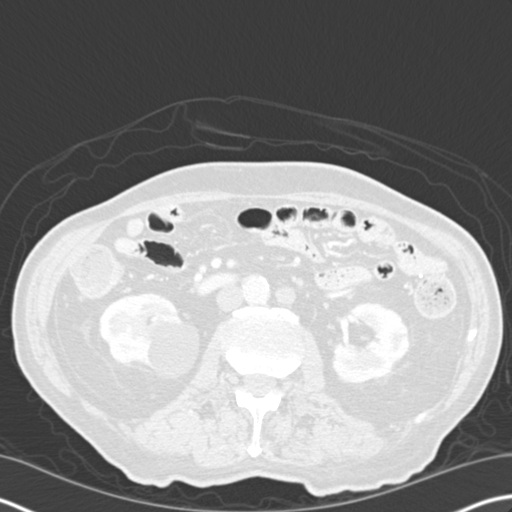
[im 96/192  soft-tissue]
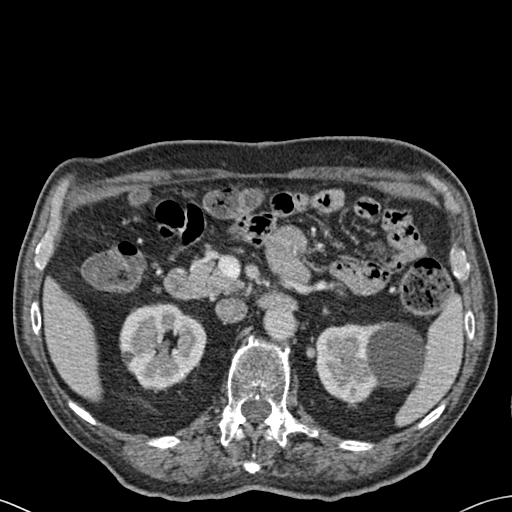
[im 96/192  lung]
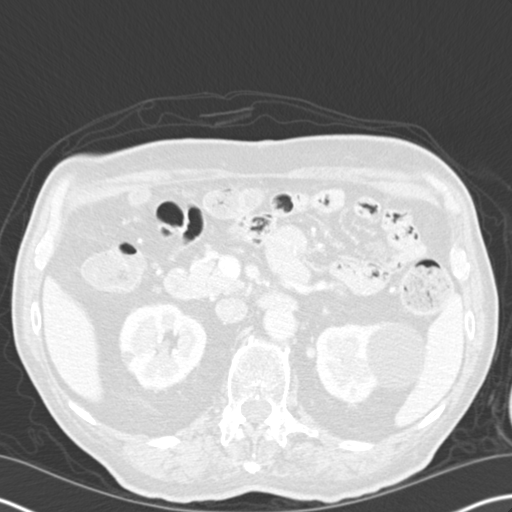
[im 128/192  soft-tissue]
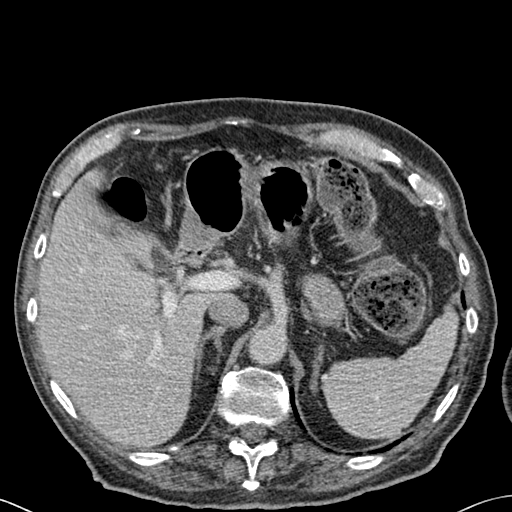
[im 128/192  lung]
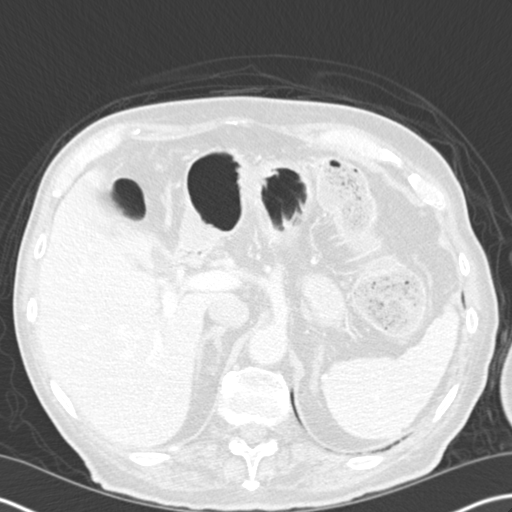
[im 160/192  soft-tissue]
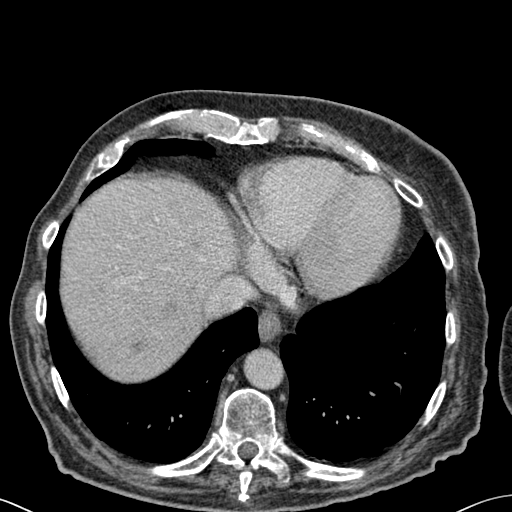
[im 160/192  lung]
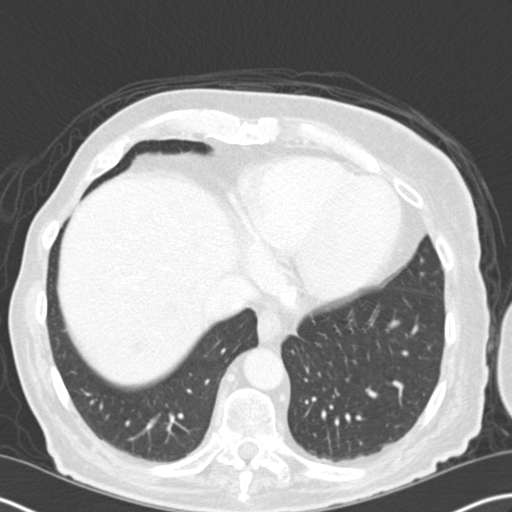

[13 of 32 positions shown; findings below may reference images not displayed]

FINDINGS: Lower chest: 6 mm left lower lobe nodule on image number 4 was not
clearly seen previously. The lung bases are otherwise clear. The
heart size is at the upper limits of normal. Coronary artery
calcifications are present.

Hepatobiliary: There are numerous hypo enhancing liver lesions, most
prominent within the dome of the right hepatic lobe. These are best
seen on the portal phase images and include a 10 mm lesion
anteriorly on image number 31 and a 9 mm lesion posteriorly on image
34 of series 9. There are probable small lesions within the left
lobe. No significant biliary dilatation status post cholecystectomy.

Pancreas: The pancreatic head appears normal. There is
hypoenhancement within the pancreatic body associated with mild
ductal dilatation within the pancreatic body and tail. There is a 10
mm low-density lesion superiorly in the pancreatic body which is
most obvious on the coronal images.

Spleen: Normal in size without focal abnormality.

Adrenals/Urinary Tract: Both adrenal glands appear normal. There is
a nonobstructing calculus in the lower pole of the left kidney.
There are bilateral renal cysts, measuring up to 4.8 cm in the upper
interpolar region of the left kidney. Some of the lesions are too
small to optimally characterize, although no enhancing or otherwise
suspicious renal findings.

Stomach/Bowel: No evidence of bowel wall thickening, distention or
surrounding inflammatory change.

Vascular/Lymphatic: There are no enlarged abdominal lymph nodes.
There is atherosclerosis of the aorta, its branches and iliac
arteries. The IVC is partially duplicated.

Other: No evidence of abdominal wall mass or hernia.

Musculoskeletal: No acute or significant osseous findings.
IMPRESSION: 1. Multiple hypo enhancing hepatic lesions highly worrisome for
metastatic disease.
2. In this context, there is suspicion of a small lesion involving
the pancreatic body with associated dilatation of the pancreatic
duct, worrisome for primary pancreatic adenocarcinoma.
3. Abdominal MRI without and with contrast may be helpful for
further evaluation. Tissue sampling is recommended. This may be best
done using endoscopic ultrasound guidance to biopsy the pancreas.
4. Indeterminate small pulmonary nodule in the left lower lobe.
5. These results will be called to the ordering clinician or
representative by the Radiologist Assistant, and communication
documented in the PACS or zVision Dashboard.
